# Patient Record
Sex: Male | Born: 1950 | Race: White | Hispanic: No | Marital: Married | Smoking: Former smoker
Health system: Southern US, Community
[De-identification: ages and names within clinical notes are randomized; demographics above are authoritative.]

## PROBLEM LIST (undated history)

## (undated) DIAGNOSIS — C801 Malignant (primary) neoplasm, unspecified: Secondary | ICD-10-CM

## (undated) DIAGNOSIS — B1920 Unspecified viral hepatitis C without hepatic coma: Secondary | ICD-10-CM

---

## 2004-02-09 ENCOUNTER — Ambulatory Visit: Payer: Self-pay | Admitting: Family Medicine

## 2006-10-26 ENCOUNTER — Ambulatory Visit: Payer: Self-pay | Admitting: Family Medicine

## 2006-10-26 DIAGNOSIS — L03019 Cellulitis of unspecified finger: Secondary | ICD-10-CM

## 2008-03-08 ENCOUNTER — Emergency Department (HOSPITAL_COMMUNITY): Admission: EM | Admit: 2008-03-08 | Discharge: 2008-03-08 | Payer: Self-pay | Admitting: Emergency Medicine

## 2008-03-28 ENCOUNTER — Ambulatory Visit: Payer: Self-pay | Admitting: Family Medicine

## 2008-03-28 DIAGNOSIS — B3749 Other urogenital candidiasis: Secondary | ICD-10-CM | POA: Insufficient documentation

## 2012-03-17 ENCOUNTER — Other Ambulatory Visit (HOSPITAL_COMMUNITY): Payer: Self-pay | Admitting: Dentistry

## 2012-03-17 DIAGNOSIS — C411 Malignant neoplasm of mandible: Secondary | ICD-10-CM

## 2012-03-23 ENCOUNTER — Ambulatory Visit (HOSPITAL_COMMUNITY)
Admission: RE | Admit: 2012-03-23 | Discharge: 2012-03-23 | Disposition: A | Discharge status: Home / Self Care | Payer: Medicaid Other | Source: Ambulatory Visit | Attending: Dentistry | Admitting: Dentistry

## 2012-03-23 DIAGNOSIS — C411 Malignant neoplasm of mandible: Secondary | ICD-10-CM | POA: Insufficient documentation

## 2012-03-23 DIAGNOSIS — K8020 Calculus of gallbladder without cholecystitis without obstruction: Secondary | ICD-10-CM | POA: Insufficient documentation

## 2012-03-23 MED ORDER — IOHEXOL 300 MG/ML  SOLN: 100.0000 mL | Freq: Once | INTRAMUSCULAR | Status: AC | PRN

## 2012-03-23 MED ORDER — IOHEXOL 300 MG/ML  SOLN
100.0000 mL | Freq: Once | INTRAMUSCULAR | Status: AC | PRN
  Administered 2012-03-23: 100 mL via INTRAVENOUS

## 2012-06-01 HISTORY — PX: OTHER SURGICAL HISTORY: SHX169

## 2012-08-14 ENCOUNTER — Encounter (HOSPITAL_COMMUNITY): Payer: Self-pay | Admitting: *Deleted

## 2012-08-14 ENCOUNTER — Emergency Department (HOSPITAL_COMMUNITY)
Admission: EM | Admit: 2012-08-14 | Discharge: 2012-08-14 | Disposition: A | Discharge status: Home / Self Care | Payer: Medicaid Other | Attending: Emergency Medicine | Admitting: Emergency Medicine

## 2012-08-14 DIAGNOSIS — Z8619 Personal history of other infectious and parasitic diseases: Secondary | ICD-10-CM | POA: Insufficient documentation

## 2012-08-14 DIAGNOSIS — R1110 Vomiting, unspecified: Secondary | ICD-10-CM

## 2012-08-14 DIAGNOSIS — Z79899 Other long term (current) drug therapy: Secondary | ICD-10-CM | POA: Insufficient documentation

## 2012-08-14 DIAGNOSIS — Z85810 Personal history of malignant neoplasm of tongue: Secondary | ICD-10-CM | POA: Insufficient documentation

## 2012-08-14 DIAGNOSIS — Z87891 Personal history of nicotine dependence: Secondary | ICD-10-CM | POA: Insufficient documentation

## 2012-08-14 HISTORY — DX: Malignant (primary) neoplasm, unspecified: C80.1

## 2012-08-14 HISTORY — DX: Unspecified viral hepatitis C without hepatic coma: B19.20

## 2012-08-14 LAB — COMPREHENSIVE METABOLIC PANEL
AST: 47 U/L — ABNORMAL HIGH (ref 0–37)
BUN: 8 mg/dL (ref 6–23)
CO2: 34 mEq/L — ABNORMAL HIGH (ref 19–32)
Chloride: 96 mEq/L (ref 96–112)
Creatinine, Ser: 0.63 mg/dL (ref 0.50–1.35)
GFR calc non Af Amer: >90 mL/min (ref >90)
Total Bilirubin: 0.7 mg/dL (ref 0.3–1.2)

## 2012-08-14 LAB — CBC
HCT: 45.6 % (ref 39.0–52.0)
MCV: 86.9 fL (ref 78.0–100.0)
RBC: 5.25 MIL/uL (ref 4.22–5.81)
WBC: 10.4 10*3/uL (ref 4.0–10.5)

## 2012-08-14 MED ORDER — ONDANSETRON HCL 4 MG/2ML IJ SOLN
4.0000 mg | Freq: Once | INTRAMUSCULAR | Status: DC
  Administered 2012-08-14: 4 mg via INTRAVENOUS

## 2012-08-14 MED ORDER — ONDANSETRON HCL 4 MG PO TABS: 4.0000 mg | ORAL_TABLET | Freq: Four times a day (QID) | ORAL | Status: AC

## 2012-08-14 MED ORDER — ONDANSETRON HCL 4 MG/2ML IJ SOLN
INTRAMUSCULAR | Status: AC
  Filled 2012-08-14: qty 2

## 2012-08-14 MED ORDER — SODIUM CHLORIDE 0.9 % IV BOLUS (SEPSIS)
1000.0000 mL | Freq: Once | INTRAVENOUS | Status: AC
  Administered 2012-08-14: 1000 mL via INTRAVENOUS

## 2012-08-14 MED ORDER — HYDROMORPHONE HCL PF 2 MG/ML IJ SOLN
2.0000 mg | Freq: Once | INTRAMUSCULAR | Status: AC
  Administered 2012-08-14: 2 mg via INTRAVENOUS
  Filled 2012-08-14: qty 1

## 2012-08-14 NOTE — ED Notes (Signed)
Pt ambulated to bathroom 

## 2012-08-14 NOTE — ED Notes (Signed)
Pt received feeding through g-tube 30 minutes ago, does not feel nauseated at all. Pain still under control.

## 2012-08-14 NOTE — ED Provider Notes (Signed)
History     CSN: [id number]  Arrival date & time 08/14/12  7829   First MD Initiated Contact with Patient 08/14/12 0940      No chief complaint on file.   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  This was treated surgically two months and he travels to [facility] for radiation 5 days a week.  Since yesterday, the patient has been experiencing severe nausea and vomiting.  He receives his nourishment through feeding tube and seems to vomit this up immediately.  He denies abdominal pain or diarrhea.  No fevers or chills.    The history is provided by the patient and the spouse.    Past Medical History  Diagnosis Date  . Cancer     tongue and neck  . Hepatitis C     Past Surgical History  Procedure Laterality Date  . Other surgical history  06/01/2012    part of tongue removed and reconstructed    No family history on file.  History  Substance Use Topics  . Smoking status: Former Games developer  . Smokeless tobacco: Never Used  . Alcohol Use: No      Review of Systems  All other systems reviewed and are negative.    Allergies  Codeine  Home Medications   Current Outpatient Rx  Name  Route  Sig  Dispense  Refill  . Alum & Mag Hydroxide-Simeth (GI COCKTAIL) SUSP suspension   Swish & Spit   Swish and spit 5 mLs 4 (four) times daily as needed (for pain). Shake well.         . Alum & Mag Hydroxide-Simeth (MAGIC MOUTHWASH) SOLN   Swish & Spit   Swish and spit 10 mLs 4 (four) times daily as needed (for thrush).         . fentaNYL (DURAGESIC - DOSED MCG/HR) 25 MCG/HR   Transdermal   Place 1 patch onto the skin every 3 (three) days.         Marland Kitchen oxyCODONE (ROXICODONE) 5 MG/5ML solution   Oral   Take 5-10 mg by mouth every 4 (four) hours as needed for pain.         Marland Kitchen prochlorperazine (COMPAZINE) 10 MG tablet   Tube   Give 10 mg by  tube every 6 (six) hours as needed (for nausea/vomiting).           BP 123/70  Pulse 109  Temp(Src) 98.1 F (36.7 C) (Oral)  Resp 18  Wt 163 lb (73.936 kg)  SpO2 95%  Physical Exam  Nursing note and vitals reviewed. Constitutional: He is oriented to person, place, and time. He appears well-developed and well-nourished. No distress.  HENT:  Head: Normocephalic and atraumatic.  The PO is erythematous and appears inflamed.  There is no bleeding.  He does seem to cough and produce a clear phlegm.  Eyes: EOM are normal. Pupils are equal, round, and reactive to light.  Neck: Normal range of motion. Neck supple.  Cardiovascular: Normal rate and regular rhythm.   No murmur heard. Pulmonary/Chest: Effort normal and breath sounds normal. No respiratory distress. He has no wheezes. He has no rales.  Abdominal: Soft. Bowel sounds are normal. He exhibits no distension. There is no tenderness.  Musculoskeletal: Normal range of motion. He exhibits no edema.  Neurological: He is alert and oriented to person, place, and time.  Skin: Skin is warm and dry. He is not diaphoretic.  ED Course  Procedures (including critical care time)  Labs Reviewed  CBC  COMPREHENSIVE METABOLIC PANEL   No results found.   No diagnosis found.    MDM  The patient presents here with nausea and difficulty tolerating his tube feeds.  He was given ivf's and labs seem to be reassuring.  He does not appear clinically dehydrated and was able to keep a feed down here in the ED.  He feels better and wants to go home.  Wife is asking for zofran which I provide a prescription for.        Geoffery Lyons, MD 08/14/12 1248

## 2019-08-30 DIAGNOSIS — J90 Pleural effusion, not elsewhere classified: Secondary | ICD-10-CM | POA: Diagnosis not present

## 2019-08-30 DIAGNOSIS — R531 Weakness: Secondary | ICD-10-CM

## 2019-08-31 ENCOUNTER — Inpatient Hospital Stay (HOSPITAL_COMMUNITY)
Admission: AD | Admit: 2019-08-31 | Discharge: 2019-09-09 | DRG: 163 | Disposition: A | Discharge status: Home / Self Care | Payer: No Typology Code available for payment source | Source: Other Acute Inpatient Hospital | Attending: Internal Medicine | Admitting: Internal Medicine

## 2019-08-31 DIAGNOSIS — E871 Hypo-osmolality and hyponatremia: Secondary | ICD-10-CM | POA: Diagnosis present

## 2019-08-31 DIAGNOSIS — J9811 Atelectasis: Secondary | ICD-10-CM | POA: Diagnosis present

## 2019-08-31 DIAGNOSIS — R000 Tachycardia, unspecified: Secondary | ICD-10-CM | POA: Diagnosis not present

## 2019-08-31 DIAGNOSIS — Y839 Surgical procedure, unspecified as the cause of abnormal reaction of the patient, or of later complication, without mention of misadventure at the time of the procedure: Secondary | ICD-10-CM | POA: Diagnosis not present

## 2019-08-31 DIAGNOSIS — D509 Iron deficiency anemia, unspecified: Secondary | ICD-10-CM | POA: Diagnosis present

## 2019-08-31 DIAGNOSIS — J939 Pneumothorax, unspecified: Secondary | ICD-10-CM

## 2019-08-31 DIAGNOSIS — I9589 Other hypotension: Secondary | ICD-10-CM | POA: Diagnosis not present

## 2019-08-31 DIAGNOSIS — Z8619 Personal history of other infectious and parasitic diseases: Secondary | ICD-10-CM | POA: Diagnosis not present

## 2019-08-31 DIAGNOSIS — Z9689 Presence of other specified functional implants: Secondary | ICD-10-CM

## 2019-08-31 DIAGNOSIS — Z681 Body mass index (BMI) 19 or less, adult: Secondary | ICD-10-CM | POA: Diagnosis not present

## 2019-08-31 DIAGNOSIS — M7989 Other specified soft tissue disorders: Secondary | ICD-10-CM | POA: Diagnosis not present

## 2019-08-31 DIAGNOSIS — Z20822 Contact with and (suspected) exposure to covid-19: Secondary | ICD-10-CM | POA: Diagnosis present

## 2019-08-31 DIAGNOSIS — R531 Weakness: Secondary | ICD-10-CM | POA: Diagnosis not present

## 2019-08-31 DIAGNOSIS — Z885 Allergy status to narcotic agent status: Secondary | ICD-10-CM | POA: Diagnosis not present

## 2019-08-31 DIAGNOSIS — F329 Major depressive disorder, single episode, unspecified: Secondary | ICD-10-CM | POA: Diagnosis present

## 2019-08-31 DIAGNOSIS — E43 Unspecified severe protein-calorie malnutrition: Secondary | ICD-10-CM | POA: Diagnosis present

## 2019-08-31 DIAGNOSIS — R54 Age-related physical debility: Secondary | ICD-10-CM | POA: Diagnosis present

## 2019-08-31 DIAGNOSIS — Z85819 Personal history of malignant neoplasm of unspecified site of lip, oral cavity, and pharynx: Secondary | ICD-10-CM | POA: Diagnosis not present

## 2019-08-31 DIAGNOSIS — J90 Pleural effusion, not elsewhere classified: Secondary | ICD-10-CM | POA: Diagnosis not present

## 2019-08-31 DIAGNOSIS — Z79899 Other long term (current) drug therapy: Secondary | ICD-10-CM

## 2019-08-31 DIAGNOSIS — R0902 Hypoxemia: Secondary | ICD-10-CM | POA: Diagnosis present

## 2019-08-31 DIAGNOSIS — Z800 Family history of malignant neoplasm of digestive organs: Secondary | ICD-10-CM

## 2019-08-31 DIAGNOSIS — Z79891 Long term (current) use of opiate analgesic: Secondary | ICD-10-CM

## 2019-08-31 DIAGNOSIS — Z923 Personal history of irradiation: Secondary | ICD-10-CM | POA: Diagnosis not present

## 2019-08-31 DIAGNOSIS — R64 Cachexia: Secondary | ICD-10-CM | POA: Diagnosis present

## 2019-08-31 DIAGNOSIS — Z8249 Family history of ischemic heart disease and other diseases of the circulatory system: Secondary | ICD-10-CM

## 2019-08-31 DIAGNOSIS — E1165 Type 2 diabetes mellitus with hyperglycemia: Secondary | ICD-10-CM | POA: Diagnosis present

## 2019-08-31 DIAGNOSIS — J95811 Postprocedural pneumothorax: Secondary | ICD-10-CM | POA: Diagnosis not present

## 2019-08-31 DIAGNOSIS — Z419 Encounter for procedure for purposes other than remedying health state, unspecified: Secondary | ICD-10-CM

## 2019-08-31 DIAGNOSIS — J869 Pyothorax without fistula: Principal | ICD-10-CM | POA: Diagnosis present

## 2019-08-31 DIAGNOSIS — E878 Other disorders of electrolyte and fluid balance, not elsewhere classified: Secondary | ICD-10-CM | POA: Diagnosis present

## 2019-08-31 DIAGNOSIS — Z87891 Personal history of nicotine dependence: Secondary | ICD-10-CM

## 2019-08-31 DIAGNOSIS — E876 Hypokalemia: Secondary | ICD-10-CM | POA: Diagnosis present

## 2019-08-31 DIAGNOSIS — I808 Phlebitis and thrombophlebitis of other sites: Secondary | ICD-10-CM | POA: Diagnosis not present

## 2019-08-31 DIAGNOSIS — Z931 Gastrostomy status: Secondary | ICD-10-CM | POA: Diagnosis not present

## 2019-08-31 DIAGNOSIS — Z833 Family history of diabetes mellitus: Secondary | ICD-10-CM

## 2019-08-31 DIAGNOSIS — R0602 Shortness of breath: Secondary | ICD-10-CM

## 2019-08-31 MED ORDER — ACETAMINOPHEN 325 MG PO TABS: 650.0000 mg | ORAL_TABLET | Freq: Four times a day (QID) | ORAL | Status: DC | PRN

## 2019-08-31 MED ORDER — ONDANSETRON HCL 4 MG PO TABS: 4.0000 mg | ORAL_TABLET | Freq: Four times a day (QID) | ORAL | Status: DC | PRN

## 2019-08-31 MED ORDER — ONDANSETRON HCL 4 MG/2ML IJ SOLN: 4.0000 mg | Freq: Four times a day (QID) | INTRAMUSCULAR | Status: DC | PRN

## 2019-08-31 MED ORDER — ACETAMINOPHEN 650 MG RE SUPP: 650.0000 mg | Freq: Four times a day (QID) | RECTAL | Status: DC | PRN

## 2019-08-31 MED ORDER — POLYETHYLENE GLYCOL 3350 17 G PO PACK: 17.0000 g | PACK | Freq: Every day | ORAL | Status: DC | PRN

## 2019-09-01 ENCOUNTER — Inpatient Hospital Stay (HOSPITAL_COMMUNITY): Payer: No Typology Code available for payment source | Admitting: Anesthesiology

## 2019-09-01 ENCOUNTER — Encounter (HOSPITAL_COMMUNITY)
Admission: AD | Disposition: A | Discharge status: Home / Self Care | Payer: Self-pay | Source: Other Acute Inpatient Hospital | Attending: Cardiothoracic Surgery

## 2019-09-01 ENCOUNTER — Inpatient Hospital Stay (HOSPITAL_COMMUNITY): Payer: No Typology Code available for payment source

## 2019-09-01 ENCOUNTER — Encounter (HOSPITAL_COMMUNITY): Payer: Self-pay | Admitting: Internal Medicine

## 2019-09-01 ENCOUNTER — Other Ambulatory Visit: Payer: Self-pay

## 2019-09-01 DIAGNOSIS — J869 Pyothorax without fistula: Secondary | ICD-10-CM

## 2019-09-01 DIAGNOSIS — E1165 Type 2 diabetes mellitus with hyperglycemia: Secondary | ICD-10-CM

## 2019-09-01 DIAGNOSIS — E871 Hypo-osmolality and hyponatremia: Secondary | ICD-10-CM

## 2019-09-01 DIAGNOSIS — E43 Unspecified severe protein-calorie malnutrition: Secondary | ICD-10-CM

## 2019-09-01 DIAGNOSIS — B1920 Unspecified viral hepatitis C without hepatic coma: Secondary | ICD-10-CM | POA: Insufficient documentation

## 2019-09-01 HISTORY — PX: CENTRAL VENOUS CATHETER INSERTION: SHX401

## 2019-09-01 HISTORY — PX: VIDEO ASSISTED THORACOSCOPY (VATS)/EMPYEMA: SHX6172

## 2019-09-01 LAB — COMPREHENSIVE METABOLIC PANEL
ALT: 24 U/L (ref 0–44)
AST: 35 U/L (ref 15–41)
Albumin: 1.5 g/dL — ABNORMAL LOW (ref 3.5–5.0)
Alkaline Phosphatase: 75 U/L (ref 38–126)
Anion gap: 7 (ref 5–15)
BUN: <5 mg/dL — ABNORMAL LOW (ref 8–23)
CO2: 37 mmol/L — ABNORMAL HIGH (ref 22–32)
Calcium: 7.9 mg/dL — ABNORMAL LOW (ref 8.9–10.3)
Chloride: 84 mmol/L — ABNORMAL LOW (ref 98–111)
Creatinine, Ser: 0.59 mg/dL — ABNORMAL LOW (ref 0.61–1.24)
GFR calc Af Amer: >60 mL/min (ref >60)
GFR calc non Af Amer: >60 mL/min (ref >60)
Glucose, Bld: 148 mg/dL — ABNORMAL HIGH (ref 70–99)
Potassium: 3.5 mmol/L (ref 3.5–5.1)
Sodium: 128 mmol/L — ABNORMAL LOW (ref 135–145)
Total Bilirubin: 0.6 mg/dL (ref 0.3–1.2)
Total Protein: 6.1 g/dL — ABNORMAL LOW (ref 6.5–8.1)

## 2019-09-01 LAB — HEMOGLOBIN A1C
Hgb A1c MFr Bld: 8 % — ABNORMAL HIGH (ref 4.8–5.6)
Mean Plasma Glucose: 182.9 mg/dL

## 2019-09-01 LAB — CBC WITH DIFFERENTIAL/PLATELET
Abs Immature Granulocytes: 0.05 10*3/uL (ref 0.00–0.07)
Basophils Absolute: 0 10*3/uL (ref 0.0–0.1)
Basophils Relative: 0 %
Eosinophils Absolute: 0 10*3/uL (ref 0.0–0.5)
Eosinophils Relative: 0 %
HCT: 36.8 % — ABNORMAL LOW (ref 39.0–52.0)
Hemoglobin: 11.9 g/dL — ABNORMAL LOW (ref 13.0–17.0)
Immature Granulocytes: 1 %
Lymphocytes Relative: 5 %
Lymphs Abs: 0.5 10*3/uL — ABNORMAL LOW (ref 0.7–4.0)
MCH: 28.5 pg (ref 26.0–34.0)
MCHC: 32.3 g/dL (ref 30.0–36.0)
MCV: 88.2 fL (ref 80.0–100.0)
Monocytes Absolute: 0.7 10*3/uL (ref 0.1–1.0)
Monocytes Relative: 7 %
Neutro Abs: 9.6 10*3/uL — ABNORMAL HIGH (ref 1.7–7.7)
Neutrophils Relative %: 87 %
Platelets: 580 10*3/uL — ABNORMAL HIGH (ref 150–400)
RBC: 4.17 MIL/uL — ABNORMAL LOW (ref 4.22–5.81)
RDW: 12.8 % (ref 11.5–15.5)
WBC: 10.9 10*3/uL — ABNORMAL HIGH (ref 4.0–10.5)
nRBC: 0 % (ref 0.0–0.2)

## 2019-09-01 LAB — POCT I-STAT 7, (LYTES, BLD GAS, ICA,H+H)
Acid-Base Excess: 11 mmol/L — ABNORMAL HIGH (ref 0.0–2.0)
Acid-Base Excess: 9 mmol/L — ABNORMAL HIGH (ref 0.0–2.0)
Bicarbonate: 36.2 mmol/L — ABNORMAL HIGH (ref 20.0–28.0)
Bicarbonate: 34.6 mmol/L — ABNORMAL HIGH (ref 20.0–28.0)
Calcium, Ion: 1.09 mmol/L — ABNORMAL LOW (ref 1.15–1.40)
Calcium, Ion: 1.1 mmol/L — ABNORMAL LOW (ref 1.15–1.40)
HCT: 37 % — ABNORMAL LOW (ref 39.0–52.0)
HCT: 40 % (ref 39.0–52.0)
Hemoglobin: 12.6 g/dL — ABNORMAL LOW (ref 13.0–17.0)
Hemoglobin: 13.6 g/dL (ref 13.0–17.0)
O2 Saturation: 100 %
O2 Saturation: 98 %
Patient temperature: 97.6
Patient temperature: 97.6
Potassium: 3.3 mmol/L — ABNORMAL LOW (ref 3.5–5.1)
Potassium: 3.4 mmol/L — ABNORMAL LOW (ref 3.5–5.1)
Sodium: 131 mmol/L — ABNORMAL LOW (ref 135–145)
Sodium: 130 mmol/L — ABNORMAL LOW (ref 135–145)
TCO2: 38 mmol/L — ABNORMAL HIGH (ref 22–32)
TCO2: 36 mmol/L — ABNORMAL HIGH (ref 22–32)
pCO2 arterial: 48.5 mmHg — ABNORMAL HIGH (ref 32.0–48.0)
pCO2 arterial: 50.5 mmHg — ABNORMAL HIGH (ref 32.0–48.0)
pH, Arterial: 7.479 — ABNORMAL HIGH (ref 7.350–7.450)
pH, Arterial: 7.442 (ref 7.350–7.450)
pO2, Arterial: 174 mmHg — ABNORMAL HIGH (ref 83.0–108.0)
pO2, Arterial: 109 mmHg — ABNORMAL HIGH (ref 83.0–108.0)

## 2019-09-01 LAB — BLOOD GAS, ARTERIAL
Acid-Base Excess: 14.9 mmol/L — ABNORMAL HIGH (ref 0.0–2.0)
Bicarbonate: 39.1 mmol/L — ABNORMAL HIGH (ref 20.0–28.0)
FIO2: 32
O2 Saturation: 95.5 %
Patient temperature: 37
pCO2 arterial: 47 mmHg (ref 32.0–48.0)
pH, Arterial: 7.53 — ABNORMAL HIGH (ref 7.350–7.450)
pO2, Arterial: 71.2 mmHg — ABNORMAL LOW (ref 83.0–108.0)

## 2019-09-01 LAB — GLUCOSE, CAPILLARY
Glucose-Capillary: 150 mg/dL — ABNORMAL HIGH (ref 70–99)
Glucose-Capillary: 119 mg/dL — ABNORMAL HIGH (ref 70–99)
Glucose-Capillary: 115 mg/dL — ABNORMAL HIGH (ref 70–99)
Glucose-Capillary: 109 mg/dL — ABNORMAL HIGH (ref 70–99)
Glucose-Capillary: 129 mg/dL — ABNORMAL HIGH (ref 70–99)

## 2019-09-01 LAB — BASIC METABOLIC PANEL
Anion gap: 9 (ref 5–15)
BUN: <5 mg/dL — ABNORMAL LOW (ref 8–23)
CO2: 34 mmol/L — ABNORMAL HIGH (ref 22–32)
Calcium: 7.6 mg/dL — ABNORMAL LOW (ref 8.9–10.3)
Chloride: 84 mmol/L — ABNORMAL LOW (ref 98–111)
Creatinine, Ser: 0.55 mg/dL — ABNORMAL LOW (ref 0.61–1.24)
GFR calc Af Amer: >60 mL/min (ref >60)
GFR calc non Af Amer: >60 mL/min (ref >60)
Glucose, Bld: 118 mg/dL — ABNORMAL HIGH (ref 70–99)
Potassium: 3.3 mmol/L — ABNORMAL LOW (ref 3.5–5.1)
Sodium: 127 mmol/L — ABNORMAL LOW (ref 135–145)

## 2019-09-01 LAB — URINALYSIS, ROUTINE W REFLEX MICROSCOPIC
Bilirubin Urine: NEGATIVE
Glucose, UA: NEGATIVE mg/dL
Hgb urine dipstick: NEGATIVE
Ketones, ur: NEGATIVE mg/dL
Leukocytes,Ua: NEGATIVE
Nitrite: NEGATIVE
Protein, ur: NEGATIVE mg/dL
Specific Gravity, Urine: 1.024 (ref 1.005–1.030)
pH: 7 (ref 5.0–8.0)

## 2019-09-01 LAB — APTT: aPTT: 37 seconds — ABNORMAL HIGH (ref 24–36)

## 2019-09-01 LAB — LACTIC ACID, PLASMA: Lactic Acid, Venous: 1.1 mmol/L (ref 0.5–1.9)

## 2019-09-01 LAB — PROTIME-INR
INR: 1.1 (ref 0.8–1.2)
Prothrombin Time: 13.4 seconds (ref 11.4–15.2)

## 2019-09-01 LAB — MAGNESIUM: Magnesium: 1.8 mg/dL (ref 1.7–2.4)

## 2019-09-01 LAB — C-REACTIVE PROTEIN: CRP: 13.2 mg/dL — ABNORMAL HIGH (ref <1.0)

## 2019-09-01 LAB — PREPARE RBC (CROSSMATCH)

## 2019-09-01 LAB — ABO/RH: ABO/RH(D): A POS

## 2019-09-01 LAB — SURGICAL PCR SCREEN
MRSA, PCR: NEGATIVE
Staphylococcus aureus: NEGATIVE

## 2019-09-01 LAB — SARS CORONAVIRUS 2 BY RT PCR (HOSPITAL ORDER, PERFORMED IN ~~LOC~~ HOSPITAL LAB): SARS Coronavirus 2: NEGATIVE

## 2019-09-01 LAB — TSH: TSH: 9.442 u[IU]/mL — ABNORMAL HIGH (ref 0.350–4.500)

## 2019-09-01 LAB — HIV ANTIBODY (ROUTINE TESTING W REFLEX): HIV Screen 4th Generation wRfx: NONREACTIVE

## 2019-09-01 LAB — OSMOLALITY, URINE: Osmolality, Ur: 529 mOsm/kg (ref 300–900)

## 2019-09-01 LAB — OSMOLALITY: Osmolality: 270 mOsm/kg — ABNORMAL LOW (ref 275–295)

## 2019-09-01 LAB — SODIUM, URINE, RANDOM: Sodium, Ur: 127 mmol/L

## 2019-09-01 LAB — CORTISOL: Cortisol, Plasma: 18.9 ug/dL

## 2019-09-01 SURGERY — VIDEO ASSISTED THORACOSCOPY (VATS)/EMPYEMA
Anesthesia: General | Site: Neck | Laterality: Right

## 2019-09-01 MED ORDER — SUCCINYLCHOLINE CHLORIDE 200 MG/10ML IV SOSY
PREFILLED_SYRINGE | INTRAVENOUS | Status: DC | PRN
Start: 2019-09-01 — End: 2019-09-01
  Administered 2019-09-01: 80 mg via INTRAVENOUS

## 2019-09-01 MED ORDER — VANCOMYCIN HCL 750 MG/150ML IV SOLN
750.0000 mg | Freq: Two times a day (BID) | INTRAVENOUS | Status: DC
  Administered 2019-09-02 – 2019-09-05 (×7): 750 mg via INTRAVENOUS
  Filled 2019-09-01 (×8): qty 150

## 2019-09-01 MED ORDER — ONDANSETRON HCL 4 MG/2ML IJ SOLN
INTRAMUSCULAR | Status: DC | PRN
  Administered 2019-09-01: 4 mg via INTRAVENOUS

## 2019-09-01 MED ORDER — GLUCERNA 1.5 CAL PO LIQD: 474.0000 mL | Freq: Four times a day (QID) | ORAL | Status: DC

## 2019-09-01 MED ORDER — ORAL CARE MOUTH RINSE
15.0000 mL | OROMUCOSAL | Status: DC
  Administered 2019-09-01 – 2019-09-02 (×5): 15 mL via OROMUCOSAL

## 2019-09-01 MED ORDER — VANCOMYCIN HCL 750 MG/150ML IV SOLN
750.0000 mg | Freq: Two times a day (BID) | INTRAVENOUS | Status: DC
  Administered 2019-09-01: 750 mg via INTRAVENOUS
  Filled 2019-09-01: qty 150

## 2019-09-01 MED ORDER — POLYETHYLENE GLYCOL 3350 17 G PO PACK: 17.0000 g | PACK | Freq: Every day | ORAL | Status: DC | PRN

## 2019-09-01 MED ORDER — ACETAMINOPHEN 325 MG PO TABS: 650.0000 mg | ORAL_TABLET | Freq: Four times a day (QID) | ORAL | Status: DC | PRN

## 2019-09-01 MED ORDER — FENTANYL CITRATE (PF) 100 MCG/2ML IJ SOLN: 25.0000 ug | INTRAMUSCULAR | Status: DC | PRN

## 2019-09-01 MED ORDER — CHLORHEXIDINE GLUCONATE 0.12 % MT SOLN: 15.0000 mL | Freq: Once | OROMUCOSAL | Status: DC

## 2019-09-01 MED ORDER — MIDAZOLAM HCL 2 MG/2ML IJ SOLN: 1.0000 mg | INTRAMUSCULAR | Status: DC | PRN

## 2019-09-01 MED ORDER — METOCLOPRAMIDE HCL 5 MG/ML IJ SOLN
5.0000 mg | Freq: Four times a day (QID) | INTRAMUSCULAR | Status: DC
  Administered 2019-09-01 – 2019-09-09 (×30): 5 mg via INTRAVENOUS
  Filled 2019-09-01 (×30): qty 2

## 2019-09-01 MED ORDER — TRAZODONE HCL 50 MG PO TABS: 25.0000 mg | ORAL_TABLET | Freq: Every day | ORAL | Status: DC

## 2019-09-01 MED ORDER — KETOROLAC TROMETHAMINE 30 MG/ML IJ SOLN
INTRAMUSCULAR | Status: AC
  Filled 2019-09-01: qty 1

## 2019-09-01 MED ORDER — SODIUM CHLORIDE 0.9% FLUSH: 10.0000 mL | INTRAVENOUS | Status: DC | PRN

## 2019-09-01 MED ORDER — ALBUTEROL SULFATE HFA 108 (90 BASE) MCG/ACT IN AERS
INHALATION_SPRAY | RESPIRATORY_TRACT | Status: AC
  Filled 2019-09-01: qty 6.7

## 2019-09-01 MED ORDER — LIVING WELL WITH DIABETES BOOK
Freq: Once | Status: AC
  Filled 2019-09-01: qty 1

## 2019-09-01 MED ORDER — INSULIN ASPART 100 UNIT/ML ~~LOC~~ SOLN: 0.0000 [IU] | SUBCUTANEOUS | Status: DC

## 2019-09-01 MED ORDER — FENTANYL CITRATE (PF) 100 MCG/2ML IJ SOLN
INTRAMUSCULAR | Status: DC | PRN
  Administered 2019-09-01 (×3): 50 ug via INTRAVENOUS

## 2019-09-01 MED ORDER — SODIUM CHLORIDE 0.9 % IV SOLN: INTRAVENOUS | Status: DC

## 2019-09-01 MED ORDER — BISACODYL 5 MG PO TBEC
10.0000 mg | DELAYED_RELEASE_TABLET | Freq: Every day | ORAL | Status: DC
  Administered 2019-09-02: 10 mg via ORAL
  Filled 2019-09-01: qty 2

## 2019-09-01 MED ORDER — PHENYLEPHRINE 40 MCG/ML (10ML) SYRINGE FOR IV PUSH (FOR BLOOD PRESSURE SUPPORT)
PREFILLED_SYRINGE | INTRAVENOUS | Status: AC
  Filled 2019-09-01: qty 40

## 2019-09-01 MED ORDER — TRAMADOL HCL 50 MG PO TABS: 50.0000 mg | ORAL_TABLET | Freq: Four times a day (QID) | ORAL | Status: DC | PRN

## 2019-09-01 MED ORDER — SODIUM CHLORIDE 0.9% FLUSH
10.0000 mL | Freq: Two times a day (BID) | INTRAVENOUS | Status: DC
  Administered 2019-09-01 – 2019-09-09 (×9): 10 mL

## 2019-09-01 MED ORDER — DULOXETINE HCL 60 MG PO CPEP
60.0000 mg | ORAL_CAPSULE | Freq: Every day | ORAL | Status: DC
  Administered 2019-09-02: 60 mg via ORAL
  Filled 2019-09-01: qty 1

## 2019-09-01 MED ORDER — ALBUMIN HUMAN 25 % IV SOLN
12.5000 g | Freq: Four times a day (QID) | INTRAVENOUS | Status: AC
  Administered 2019-09-01 – 2019-09-02 (×3): 12.5 g via INTRAVENOUS
  Filled 2019-09-01 (×3): qty 50

## 2019-09-01 MED ORDER — 0.9 % SODIUM CHLORIDE (POUR BTL) OPTIME
TOPICAL | Status: DC | PRN
  Administered 2019-09-01: 2000 mL

## 2019-09-01 MED ORDER — IPRATROPIUM-ALBUTEROL 0.5-2.5 (3) MG/3ML IN SOLN: 3.0000 mL | RESPIRATORY_TRACT | Status: DC | PRN

## 2019-09-01 MED ORDER — LIDOCAINE 2% (20 MG/ML) 5 ML SYRINGE
INTRAMUSCULAR | Status: DC | PRN
  Administered 2019-09-01: 60 mg via INTRAVENOUS

## 2019-09-01 MED ORDER — LACTATED RINGERS IV SOLN: INTRAVENOUS | Status: DC

## 2019-09-01 MED ORDER — ACETAMINOPHEN 500 MG PO TABS: 1000.0000 mg | ORAL_TABLET | Freq: Four times a day (QID) | ORAL | Status: DC

## 2019-09-01 MED ORDER — DEXTROSE-NACL 5-0.45 % IV SOLN: INTRAVENOUS | Status: DC

## 2019-09-01 MED ORDER — GUAIFENESIN-DM 100-10 MG/5ML PO SYRP
10.0000 mL | ORAL_SOLUTION | Freq: Four times a day (QID) | ORAL | Status: DC
  Administered 2019-09-01 – 2019-09-02 (×4): 10 mL via ORAL
  Filled 2019-09-01 (×4): qty 10

## 2019-09-01 MED ORDER — ONDANSETRON HCL 4 MG/2ML IJ SOLN
INTRAMUSCULAR | Status: AC
  Filled 2019-09-01: qty 6

## 2019-09-01 MED ORDER — ACETAMINOPHEN 160 MG/5ML PO SOLN
1000.0000 mg | Freq: Four times a day (QID) | ORAL | Status: DC
  Administered 2019-09-01 – 2019-09-02 (×2): 1000 mg via ORAL
  Filled 2019-09-01 (×2): qty 40.6

## 2019-09-01 MED ORDER — ALBUMIN HUMAN 5 % IV SOLN: INTRAVENOUS | Status: DC | PRN

## 2019-09-01 MED ORDER — ONDANSETRON HCL 4 MG/2ML IJ SOLN: 4.0000 mg | Freq: Four times a day (QID) | INTRAMUSCULAR | Status: DC | PRN

## 2019-09-01 MED ORDER — CHLORHEXIDINE GLUCONATE CLOTH 2 % EX PADS
6.0000 | MEDICATED_PAD | Freq: Every day | CUTANEOUS | Status: DC
  Administered 2019-09-02: 6 via TOPICAL

## 2019-09-01 MED ORDER — SODIUM CHLORIDE 0.9% IV SOLUTION: Freq: Once | INTRAVENOUS | Status: DC

## 2019-09-01 MED ORDER — DM-GUAIFENESIN ER 30-600 MG PO TB12
1.0000 | ORAL_TABLET | Freq: Two times a day (BID) | ORAL | Status: DC
  Filled 2019-09-01: qty 1

## 2019-09-01 MED ORDER — SENNOSIDES-DOCUSATE SODIUM 8.6-50 MG PO TABS: 2.0000 | ORAL_TABLET | Freq: Every evening | ORAL | Status: DC | PRN

## 2019-09-01 MED ORDER — MORPHINE SULFATE (PF) 2 MG/ML IV SOLN: 2.0000 mg | INTRAVENOUS | Status: DC | PRN

## 2019-09-01 MED ORDER — ESMOLOL HCL 100 MG/10ML IV SOLN
INTRAVENOUS | Status: AC
  Filled 2019-09-01: qty 10

## 2019-09-01 MED ORDER — MORPHINE SULFATE (PF) 2 MG/ML IV SOLN
2.0000 mg | INTRAVENOUS | Status: DC | PRN
  Administered 2019-09-01: 2 mg via INTRAVENOUS
  Filled 2019-09-01 (×2): qty 1

## 2019-09-01 MED ORDER — PHENYLEPHRINE 40 MCG/ML (10ML) SYRINGE FOR IV PUSH (FOR BLOOD PRESSURE SUPPORT)
PREFILLED_SYRINGE | INTRAVENOUS | Status: DC | PRN
  Administered 2019-09-01: 120 ug via INTRAVENOUS
  Administered 2019-09-01: 160 ug via INTRAVENOUS
  Administered 2019-09-01: 120 ug via INTRAVENOUS

## 2019-09-01 MED ORDER — DEXAMETHASONE SODIUM PHOSPHATE 10 MG/ML IJ SOLN
INTRAMUSCULAR | Status: AC
  Filled 2019-09-01: qty 3

## 2019-09-01 MED ORDER — SENNOSIDES-DOCUSATE SODIUM 8.6-50 MG PO TABS
1.0000 | ORAL_TABLET | Freq: Every day | ORAL | Status: DC
  Administered 2019-09-01: 1 via ORAL
  Filled 2019-09-01: qty 1

## 2019-09-01 MED ORDER — PHENYLEPHRINE HCL-NACL 10-0.9 MG/250ML-% IV SOLN
INTRAVENOUS | Status: DC | PRN
  Administered 2019-09-01: 50 ug/min via INTRAVENOUS

## 2019-09-01 MED ORDER — FENTANYL CITRATE (PF) 100 MCG/2ML IJ SOLN
25.0000 ug | INTRAMUSCULAR | Status: DC | PRN
  Administered 2019-09-01 – 2019-09-02 (×2): 100 ug via INTRAVENOUS
  Filled 2019-09-01 (×2): qty 2

## 2019-09-01 MED ORDER — POTASSIUM CHLORIDE IN NACL 20-0.9 MEQ/L-% IV SOLN
INTRAVENOUS | Status: DC
  Filled 2019-09-01 (×12): qty 1000

## 2019-09-01 MED ORDER — SODIUM CHLORIDE 0.9 % IV SOLN
1.0000 g | Freq: Three times a day (TID) | INTRAVENOUS | Status: DC
  Administered 2019-09-01 – 2019-09-07 (×19): 1 g via INTRAVENOUS
  Filled 2019-09-01 (×26): qty 1

## 2019-09-01 MED ORDER — DEXAMETHASONE SODIUM PHOSPHATE 10 MG/ML IJ SOLN
INTRAMUSCULAR | Status: DC | PRN
  Administered 2019-09-01: 10 mg via INTRAVENOUS

## 2019-09-01 MED ORDER — FREE WATER: 100.0000 mL | Freq: Every day | Status: DC

## 2019-09-01 MED ORDER — IOHEXOL 300 MG/ML  SOLN
75.0000 mL | Freq: Once | INTRAMUSCULAR | Status: AC | PRN
  Administered 2019-09-01: 75 mL via INTRAVENOUS

## 2019-09-01 MED ORDER — ADULT MULTIVITAMIN LIQUID CH
15.0000 mL | Freq: Every day | ORAL | Status: DC
  Administered 2019-09-02: 15 mL via ORAL
  Filled 2019-09-01: qty 15

## 2019-09-01 MED ORDER — SUCCINYLCHOLINE CHLORIDE 200 MG/10ML IV SOSY
PREFILLED_SYRINGE | INTRAVENOUS | Status: AC
  Filled 2019-09-01: qty 10

## 2019-09-01 MED ORDER — FERROUS SULFATE 220 (44 FE) MG/5ML PO ELIX
300.0000 mg | ORAL_SOLUTION | Freq: Every day | ORAL | Status: DC
  Administered 2019-09-02 – 2019-09-03 (×2): 300 mg
  Filled 2019-09-01 (×4): qty 6.9

## 2019-09-01 MED ORDER — NALOXONE HCL 0.4 MG/ML IJ SOLN
INTRAMUSCULAR | Status: AC
  Filled 2019-09-01: qty 1

## 2019-09-01 MED ORDER — CHLORHEXIDINE GLUCONATE 0.12% ORAL RINSE (MEDLINE KIT)
15.0000 mL | Freq: Two times a day (BID) | OROMUCOSAL | Status: DC
  Administered 2019-09-01 – 2019-09-02 (×2): 15 mL via OROMUCOSAL

## 2019-09-01 MED ORDER — PROPOFOL 10 MG/ML IV BOLUS
INTRAVENOUS | Status: DC | PRN
  Administered 2019-09-01 (×2): 50 mg via INTRAVENOUS

## 2019-09-01 MED ORDER — OXYCODONE HCL 5 MG/5ML PO SOLN
5.0000 mg | ORAL | Status: DC | PRN
  Administered 2019-09-01 (×2): 5 mg
  Filled 2019-09-01 (×2): qty 5

## 2019-09-01 MED ORDER — SODIUM CHLORIDE 0.9 % IV SOLN: INTRAVENOUS | Status: DC | PRN

## 2019-09-01 MED ORDER — ROCURONIUM BROMIDE 10 MG/ML (PF) SYRINGE
PREFILLED_SYRINGE | INTRAVENOUS | Status: AC
  Filled 2019-09-01: qty 10

## 2019-09-01 MED ORDER — PHENYLEPHRINE HCL-NACL 10-0.9 MG/250ML-% IV SOLN
0.0000 ug/min | INTRAVENOUS | Status: DC
  Administered 2019-09-01: 10 ug/min via INTRAVENOUS
  Administered 2019-09-02: 35 ug/min via INTRAVENOUS
  Administered 2019-09-02: 30 ug/min via INTRAVENOUS
  Administered 2019-09-03: 70 ug/min via INTRAVENOUS
  Filled 2019-09-01 (×2): qty 250
  Filled 2019-09-01: qty 500
  Filled 2019-09-01 (×2): qty 250

## 2019-09-01 MED ORDER — LIDOCAINE 2% (20 MG/ML) 5 ML SYRINGE
INTRAMUSCULAR | Status: AC
  Filled 2019-09-01: qty 15

## 2019-09-01 MED ORDER — INSULIN ASPART 100 UNIT/ML ~~LOC~~ SOLN
0.0000 [IU] | Freq: Four times a day (QID) | SUBCUTANEOUS | Status: DC
  Administered 2019-09-02: 2 [IU] via SUBCUTANEOUS
  Administered 2019-09-02: 4 [IU] via SUBCUTANEOUS
  Administered 2019-09-02: 2 [IU] via SUBCUTANEOUS
  Administered 2019-09-02: 4 [IU] via SUBCUTANEOUS
  Administered 2019-09-03 – 2019-09-04 (×3): 2 [IU] via SUBCUTANEOUS
  Administered 2019-09-05: 4 [IU] via SUBCUTANEOUS
  Administered 2019-09-05: 2 [IU] via SUBCUTANEOUS
  Administered 2019-09-05: 4 [IU] via SUBCUTANEOUS
  Administered 2019-09-05 – 2019-09-07 (×7): 2 [IU] via SUBCUTANEOUS
  Administered 2019-09-08: 4 [IU] via SUBCUTANEOUS
  Administered 2019-09-08 (×2): 2 [IU] via SUBCUTANEOUS

## 2019-09-01 MED ORDER — POTASSIUM CHLORIDE 10 MEQ/50ML IV SOLN
10.0000 meq | INTRAVENOUS | Status: AC
  Administered 2019-09-01 – 2019-09-02 (×3): 10 meq via INTRAVENOUS
  Filled 2019-09-01 (×3): qty 50

## 2019-09-01 MED ORDER — ROCURONIUM BROMIDE 10 MG/ML (PF) SYRINGE
PREFILLED_SYRINGE | INTRAVENOUS | Status: DC | PRN
  Administered 2019-09-01: 30 mg via INTRAVENOUS
  Administered 2019-09-01: 40 mg via INTRAVENOUS
  Administered 2019-09-01: 30 mg via INTRAVENOUS
  Administered 2019-09-01: 50 mg via INTRAVENOUS

## 2019-09-01 MED ORDER — HEMOSTATIC AGENTS (NO CHARGE) OPTIME
TOPICAL | Status: DC | PRN
  Administered 2019-09-01: 2 via TOPICAL

## 2019-09-01 MED ORDER — VANCOMYCIN HCL IN DEXTROSE 1-5 GM/200ML-% IV SOLN
1000.0000 mg | INTRAVENOUS | Status: AC
  Administered 2019-09-01: 1000 mg via INTRAVENOUS
  Filled 2019-09-01: qty 200

## 2019-09-01 MED ORDER — OXYCODONE HCL 5 MG PO TABS
5.0000 mg | ORAL_TABLET | ORAL | Status: DC | PRN
  Administered 2019-09-02 – 2019-09-09 (×15): 5 mg
  Filled 2019-09-01 (×16): qty 1

## 2019-09-01 MED ORDER — OXYCODONE-ACETAMINOPHEN 5-325 MG PO TABS: 1.0000 | ORAL_TABLET | ORAL | Status: DC | PRN

## 2019-09-01 MED ORDER — LACTATED RINGERS IV SOLN: INTRAVENOUS | Status: DC | PRN

## 2019-09-01 MED ORDER — TRAZODONE HCL 50 MG PO TABS
25.0000 mg | ORAL_TABLET | Freq: Every day | ORAL | Status: DC
  Administered 2019-09-01: 25 mg via ORAL
  Filled 2019-09-01: qty 1

## 2019-09-01 MED ORDER — CHLORHEXIDINE GLUCONATE 0.12 % MT SOLN
OROMUCOSAL | Status: AC
  Filled 2019-09-01: qty 15

## 2019-09-01 MED ORDER — ROCURONIUM BROMIDE 10 MG/ML (PF) SYRINGE
PREFILLED_SYRINGE | INTRAVENOUS | Status: AC
  Filled 2019-09-01: qty 30

## 2019-09-01 MED ORDER — FENTANYL CITRATE (PF) 250 MCG/5ML IJ SOLN
INTRAMUSCULAR | Status: AC
  Filled 2019-09-01: qty 5

## 2019-09-01 MED ORDER — DEXMEDETOMIDINE HCL IN NACL 400 MCG/100ML IV SOLN
0.0000 ug/kg/h | INTRAVENOUS | Status: DC
  Administered 2019-09-01 – 2019-09-02 (×2): 0.4 ug/kg/h via INTRAVENOUS
  Filled 2019-09-01 (×2): qty 100

## 2019-09-01 SURGICAL SUPPLY — 78 items
ADH SKN CLS APL DERMABOND .7 (GAUZE/BANDAGES/DRESSINGS) ×2
BAG DECANTER FOR FLEXI CONT (MISCELLANEOUS) IMPLANT
BLADE CLIPPER SURG (BLADE) ×4 IMPLANT
BLADE SURG 11 STRL SS (BLADE) ×4 IMPLANT
CANISTER SUCT 3000ML PPV (MISCELLANEOUS) ×4 IMPLANT
CATH KIT ON-Q SILVERSOAK 5 (CATHETERS) IMPLANT
CATH KIT ON-Q SILVERSOAK 5IN (CATHETERS) IMPLANT
CATH ROBINSON RED A/P 22FR (CATHETERS) IMPLANT
CATH THORACIC 28FR (CATHETERS) ×2 IMPLANT
CATH THORACIC 28FR RT ANG (CATHETERS) IMPLANT
CATH THORACIC 36FR (CATHETERS) IMPLANT
CNTNR URN SCR LID CUP LEK RST (MISCELLANEOUS) ×4 IMPLANT
CONN ST 1/4X3/8  BEN (MISCELLANEOUS) ×4
CONN ST 1/4X3/8 BEN (MISCELLANEOUS) IMPLANT
CONN Y 3/8X3/8X3/8  BEN (MISCELLANEOUS) ×4
CONN Y 3/8X3/8X3/8 BEN (MISCELLANEOUS) IMPLANT
CONT SPEC 4OZ STRL OR WHT (MISCELLANEOUS) ×8
DERMABOND ADVANCED (GAUZE/BANDAGES/DRESSINGS) ×2
DERMABOND ADVANCED .7 DNX12 (GAUZE/BANDAGES/DRESSINGS) IMPLANT
DRAPE CV SPLIT W-CLR ANES SCRN (DRAPES) ×2 IMPLANT
DRAPE LAPAROSCOPIC ABDOMINAL (DRAPES) ×4 IMPLANT
DRAPE ORTHO SPLIT 77X108 STRL (DRAPES) ×4
DRAPE SURG ORHT 6 SPLT 77X108 (DRAPES) IMPLANT
DRAPE WARM FLUID 44X44 (DRAPES) ×4 IMPLANT
ELECT REM PT RETURN 9FT ADLT (ELECTROSURGICAL) ×4
ELECTRODE REM PT RTRN 9FT ADLT (ELECTROSURGICAL) ×2 IMPLANT
GAUZE SPONGE 4X4 12PLY STRL (GAUZE/BANDAGES/DRESSINGS) ×4 IMPLANT
GAUZE XEROFORM 1X8 LF (GAUZE/BANDAGES/DRESSINGS) ×2 IMPLANT
GLOVE BIO SURGEON STRL SZ 6.5 (GLOVE) ×1 IMPLANT
GLOVE BIO SURGEONS STRL SZ 6.5 (GLOVE) ×1
GLOVE BIOGEL M STRL SZ7.5 (GLOVE) ×4 IMPLANT
GLOVE BIOGEL PI IND STRL 8 (GLOVE) IMPLANT
GLOVE BIOGEL PI INDICATOR 8 (GLOVE) ×2
GOWN STRL REUS W/ TWL LRG LVL3 (GOWN DISPOSABLE) ×6 IMPLANT
GOWN STRL REUS W/TWL LRG LVL3 (GOWN DISPOSABLE) ×12
KIT BASIN OR (CUSTOM PROCEDURE TRAY) ×4 IMPLANT
KIT SUCTION CATH 14FR (SUCTIONS) ×4 IMPLANT
KIT TURNOVER KIT B (KITS) ×4 IMPLANT
NS IRRIG 1000ML POUR BTL (IV SOLUTION) ×16 IMPLANT
PACK CHEST (CUSTOM PROCEDURE TRAY) ×4 IMPLANT
PAD ARMBOARD 7.5X6 YLW CONV (MISCELLANEOUS) ×8 IMPLANT
PROGEL SPRAY TIP 11IN (MISCELLANEOUS) ×8
SEALANT PROGEL (MISCELLANEOUS) ×4 IMPLANT
SEALANT SURG COSEAL 4ML (VASCULAR PRODUCTS) IMPLANT
SOL ANTI FOG 6CC (MISCELLANEOUS) ×2 IMPLANT
SOLUTION ANTI FOG 6CC (MISCELLANEOUS) ×2
SPONGE LAP 18X18 X RAY DECT (DISPOSABLE) ×2 IMPLANT
SPONGE TONSIL TAPE 1 RFD (DISPOSABLE) ×2 IMPLANT
SUT CHROMIC 3 0 SH 27 (SUTURE) IMPLANT
SUT ETHILON 3 0 PS 1 (SUTURE) ×8 IMPLANT
SUT PROLENE 3 0 SH DA (SUTURE) IMPLANT
SUT PROLENE 4 0 RB 1 (SUTURE)
SUT PROLENE 4-0 RB1 .5 CRCL 36 (SUTURE) IMPLANT
SUT PROLENE 6 0 C 1 30 (SUTURE) IMPLANT
SUT SILK  1 MH (SUTURE) ×8
SUT SILK 1 MH (SUTURE) ×4 IMPLANT
SUT SILK 1 TIES 10X30 (SUTURE) IMPLANT
SUT SILK 2 0SH CR/8 30 (SUTURE) IMPLANT
SUT SILK 3 0SH CR/8 30 (SUTURE) ×2 IMPLANT
SUT VIC AB 1 CTX 18 (SUTURE) ×2 IMPLANT
SUT VIC AB 2 TP1 27 (SUTURE) ×4 IMPLANT
SUT VIC AB 2-0 CT2 18 VCP726D (SUTURE) ×2 IMPLANT
SUT VIC AB 2-0 CTX 36 (SUTURE) IMPLANT
SUT VIC AB 3-0 SH 18 (SUTURE) IMPLANT
SUT VIC AB 3-0 X1 27 (SUTURE) ×2 IMPLANT
SUT VICRYL 0 UR6 27IN ABS (SUTURE) IMPLANT
SUT VICRYL 2 TP 1 (SUTURE) IMPLANT
SWAB COLLECTION DEVICE MRSA (MISCELLANEOUS) IMPLANT
SWAB CULTURE ESWAB REG 1ML (MISCELLANEOUS) IMPLANT
SYSTEM SAHARA CHEST DRAIN ATS (WOUND CARE) ×4 IMPLANT
TAPE CLOTH SURG 4X10 WHT LF (GAUZE/BANDAGES/DRESSINGS) ×2 IMPLANT
TIP APPLICATOR SPRAY EXTEND 16 (VASCULAR PRODUCTS) ×2 IMPLANT
TIP SPRAY PROGEL 11IN (MISCELLANEOUS) IMPLANT
TOWEL GREEN STERILE (TOWEL DISPOSABLE) ×4 IMPLANT
TOWEL GREEN STERILE FF (TOWEL DISPOSABLE) ×4 IMPLANT
TRAP SPECIMEN MUCUS 40CC (MISCELLANEOUS) ×2 IMPLANT
TRAY FOLEY MTR SLVR 16FR STAT (SET/KITS/TRAYS/PACK) ×4 IMPLANT
WATER STERILE IRR 1000ML POUR (IV SOLUTION) ×8 IMPLANT

## 2019-09-01 NOTE — Consult Note (Signed)
[address]       [place] [address]             [phone]        Prashant W Bazzano Alba Medical Record [id number] Date of Birth: 25-Nov-1950  Referring: [facility] hospitalists  primary Care: Laurey Morale, MD Primary Cardiologist:No primary care provider on file.  Chief Complaint: Chest pain and shortness of breath  History of Present Illness:     Patient examined, images of chest x-ray and CT scan of chest personally reviewed and discussed with patient. Chronically ill malnourished 69 year old male status post right mandibular resection with right neck dissection for malignancy 9 years ago with permanent gastrostomy feeding tube transferred from [facility] for empyema.  He presented there earlier in the week with chest discomfort and shortness of breath and weight loss.  A thoracentesis was performed there which drained 300 cc of purulent material.  Conventional radiology then placed a 12 French right pigtail catheter in the right pleural space 2 days ago which drained another 100 cc.  He has been placed on antibiotics, no culture data are available from the outside hospital.  Follow-up chest x-ray shows improvement of the empyema but entrapment of the right lung and persistent loculated fluid in the right subpulmonic space despite the presence of the pigtail catheter.  The patient is not toxic and hemodynamically stable although with severe protein malnutrition and frailty. Right VATS for drainage of empyema and decortication right lung are the best chance for recovery for this unfortunate patient.  This will be at increased risk but without surgery the process will continue to remain indolent with further progression and very poor prognosis.  Discussed the procedure in detail with the patient and he agrees to proceed with right VATS for drainage of empyema under general anesthesia.  He understands that blood transfusion therapy may be necessary and that  he will be in the ICU.  He has a functional gastrostomy tube and has been n.p.o. since last night.   Current Activity/ Functional Status: Lives at home with his wife.  Able to walk. Zubrod Score: At the time of surgery this patients most appropriate activity status/level should be described as: []     0    Normal activity, no symptoms []     1    Restricted in physical strenuous activity but ambulatory, able to do out light work []     2    Ambulatory and capable of self care, unable to do work activities, up and about                 more than 50%  Of the time                            [x]     3    Only limited self care, in bed greater than 50% of waking hours []     4    Completely disabled, no self care, confined to bed or chair []     5    Moribund  Past Medical History:  Diagnosis Date   Cancer ([place])    tongue and neck   Hepatitis C     Past Surgical History:  Procedure Laterality Date   OTHER SURGICAL HISTORY  06/01/2012   part of tongue removed and reconstructed    Social History   Tobacco Use  Smoking Status Former Smoker  Smokeless Tobacco  Never Used    Social History   Substance and Sexual Activity  Alcohol Use No     Allergies  Allergen Reactions   Codeine     REACTION: vomitting    Current Facility-Administered Medications  Medication Dose Route Frequency Provider Last Rate Last Admin   0.9 %  sodium chloride infusion   Intravenous Continuous Shalhoub, Sherryll Burger, MD 100 mL/hr at 09/01/19 0324 New Bag at 09/01/19 0324   acetaminophen (TYLENOL) tablet 650 mg  650 mg Oral Q6H PRN Amin, Ankit Chirag, MD       dextromethorphan-guaiFENesin (Sasser DM) 30-600 MG per 12 hr tablet 1 tablet  1 tablet Oral BID Amin, Jeanella Flattery, MD       [START ON 09/02/2019] DULoxetine (CYMBALTA) DR capsule 60 mg  60 mg Oral Daily Shalhoub, Sherryll Burger, MD       [START ON 09/02/2019] ferrous sulfate 220 (44 Fe) MG/5ML solution 300 mg  300 mg Per Tube Q breakfast Shalhoub,  Sherryll Burger, MD       insulin aspart (novoLOG) injection 0-9 Units  0-9 Units Subcutaneous Q4H Shalhoub, Sherryll Burger, MD       ipratropium-albuterol (DUONEB) 0.5-2.5 (3) MG/3ML nebulizer solution 3 mL  3 mL Nebulization Q4H PRN Amin, Ankit Chirag, MD       meropenem (MERREM) 1 g in sodium chloride 0.9 % 100 mL IVPB  1 g Intravenous Q8H Franky Macho, RPH 200 mL/hr at 09/01/19 0327 1 g at 09/01/19 0327   oxyCODONE (ROXICODONE) 5 MG/5ML solution 5 mg  5 mg Per Tube Q4H PRN Shalhoub, Sherryll Burger, MD       Or   morphine 2 MG/ML injection 2 mg  2 mg Intravenous Q4H PRN Shalhoub, Sherryll Burger, MD   2 mg at 09/01/19 0101   ondansetron (ZOFRAN) tablet 4 mg  4 mg Oral Q6H PRN Shalhoub, Sherryll Burger, MD       Or   ondansetron (ZOFRAN) injection 4 mg  4 mg Intravenous Q6H PRN Shalhoub, Sherryll Burger, MD       polyethylene glycol (MIRALAX / GLYCOLAX) packet 17 g  17 g Oral Daily PRN Amin, Ankit Chirag, MD       senna-docusate (Senokot-S) tablet 2 tablet  2 tablet Oral QHS PRN Amin, Ankit Chirag, MD       traZODone (DESYREL) tablet 25 mg  25 mg Oral QHS Shalhoub, Sherryll Burger, MD   25 mg at 09/01/19 0101   vancomycin (VANCOREADY) IVPB 750 mg/150 mL  750 mg Intravenous Q12H Franky Macho, RPH 150 mL/hr at 09/01/19 0423 750 mg at 09/01/19 0423    Medications Prior to Admission  Medication Sig Dispense Refill Last Dose   Alum & Mag Hydroxide-Simeth (GI COCKTAIL) SUSP suspension Swish and spit 5 mLs 4 (four) times daily as needed (for pain). Shake well.      Alum & Mag Hydroxide-Simeth (MAGIC MOUTHWASH) SOLN Swish and spit 10 mLs 4 (four) times daily as needed (for thrush).      fentaNYL (DURAGESIC - DOSED MCG/HR) 25 MCG/HR Place 1 patch onto the skin every 3 (three) days.      ondansetron (ZOFRAN) 4 MG tablet Take 1 tablet (4 mg total) by mouth every 6 (six) hours. 20 tablet 0    oxyCODONE (ROXICODONE) 5 MG/5ML solution Take 5-10 mg by mouth every 4 (four) hours as needed for pain.      prochlorperazine (COMPAZINE)  10 MG tablet Give 10 mg by tube every 6 (six) hours  as needed (for nausea/vomiting).       Family History  Problem Relation Age of Onset   Pancreatic cancer Mother    Diabetes Father    CAD Father      Review of Systems:   ROS  Right-hand-dominant No history of previous thoracic surgery Weight has been decreasing with less than 5 pound weight loss He cannot swallow at all and his speech is difficult to understand   Cardiac Review of Systems: Y or  [    ]= no  Chest Pain [ y   ]  Resting SOB [   ] Exertional SOB  Blue.Reese  ]  Orthopnea [  ]   Pedal Edema [   ]    Palpitations [  ] Syncope  [  ]   Presyncope [   ]  General Review of Systems: [Y] = yes [  ]=no Constitional: recent weight change Blue.Reese  ]; anorexia [  ]; fatigue Blue.Reese  ]; nausea [  ]; night sweats [  ]; fever [  ]; or chills [  ]                                                               Dental: Last Dentist visit:   Eye : blurred vision [  ]; diplopia [   ]; vision changes [  ];  Amaurosis fugax[  ]; Resp: cough [ y ];  wheezing[  ];  hemoptysis[  ]; shortness of breath[  ]; paroxysmal nocturnal dyspnea[  ]; dyspnea on exertion[y  ]; or orthopnea[  ];  GI:  gallstones[  ], vomiting[  ];  dysphagia[  ]; melena[  ];  hematochezia [  ]; heartburn[  ];   Hx of  Colonoscopy[  ]; GU: kidney stones [  ]; hematuria[  ];   dysuria [  ];  nocturia[  ];  history of     obstruction [  ]; urinary frequency [  ]             Skin: rash, swelling[  ];, hair loss[  ];  peripheral edema[  ];  or itching[  ]; Musculosketetal: myalgias[  ];  joint swelling[  ];  joint erythema[  ];  joint pain[  ];  back pain[  ];  Heme/Lymph: bruising[  ];  bleeding[  ];  anemia[  ];  Neuro: TIA[  ];  headaches[  ];  stroke[  ];  vertigo[  ];  seizures[  ];   paresthesias[  ];  difficulty walking[  ];  Psych:depression[  ]; anxiety[  ];  Endocrine: diabetes[ y ];  thyroid dysfunction[  ];                 Physical Exam: BP 135/88 (BP Location: Left Arm)     Pulse 77    Temp 98.4 F (36.9 C) (Oral)    Resp 17    Ht 5\' 9"  (1.753 m)    Wt 58.9 kg    SpO2 97%    BMI 19.18 kg/m       Physical Exam  General: Frail malnourished  male who appears chronically ill HEENT: Normocephalic pupils equal , dentition adequate Neck: Supple without JVD, adenopathy, or bruit Chest: Pigtail catheter in right chest with  purulent material draining and diminished breath sounds on the right side.  , no rhonchi, no tenderness             or deformity Cardiovascular: Regular rate and rhythm, no murmur, no gallop, peripheral pulses             palpable in all extremities Abdomen:  Soft, nontender, no palpable mass or organomegaly Extremities: Warm, well-perfused, no clubbing cyanosis edema or tenderness,              no venous stasis changes of the legs Rectal/GU: Deferred Neuro: Grossly non--focal and symmetrical throughout Skin: Clean and dry without rash or ulceration    Diagnostic Studies & Laboratory data:     Recent Radiology Findings:   CT CHEST W CONTRAST  Result Date: 09/01/2019 CLINICAL DATA:  Abnormal chest radiograph, pleural effusion, empyema EXAM: CT CHEST WITH CONTRAST TECHNIQUE: Multidetector CT imaging of the chest was performed during intravenous contrast administration. CONTRAST:  21mL OMNIPAQUE IOHEXOL 300 MG/ML  SOLN COMPARISON:  CT 08/30/2019 FINDINGS: Cardiovascular: Upper limits normal cardiac size. Three-vessel coronary artery disease is noted. Small pericardial effusion, stable from prior. Atherosclerotic plaque within the normal caliber aorta. Three vessel branching of the aortic arch with minimal plaque in the proximal subclavian arteries. Remaining great vessels are unremarkable. Central pulmonary arteries are normal caliber. Contrast bolus is inadequate for assessment of pulmonary emboli. Mediastinum/Nodes: No mediastinal fluid or gas. Normal thyroid gland and thoracic inlet. There are extensive secretions throughout the airways including  abrupt narrowing of the central airways to the right middle and lower lobe (3/80, 3/72). High attenuation contrast material within the thoracic esophagus, correlate for reflux. Scattered low-attenuation subcentimeter mediastinal and hilar nodes are likely reactive. No worrisome mediastinal, hilar or axillary adenopathy. Lungs/Pleura: There is a right pigtail pleural drain entering at the sixth interspace, mid axillary line and positioned posteriorly in the mid to lower lung within a large, thick-walled air and fluid containing collection compatible with partially drained empyema seen previously. Overall size of this collection has significantly decreased from prior exam but does remain moderate in overall extent. There is extensive visceral and parietal pleural thickening is noted throughout the right hemithorax. Concave margins of the an expanded lung may suggest some in trapped lung with failed re-expansion secondary to the active pleural inflammation. There is extensive residual passive atelectasis throughout the right hemithorax including near complete atelectatic collapse of the right lower lobe. Associated diffuse airways thickening is again seen. There scattered areas of peripheral ground-glass in the right middle and upper lobe. A small volume left pleural effusion without complicating pleural thickening is noted. Several fluid-filled lower lobe airways are present as well in the left lower lobe with adjacent peribronchovascular opacity likely reflecting developing consolidation/inflammation. Upper Abdomen: Mild periportal edema within the liver as well as small volume perihepatic ascites. Heterogeneous attenuation of the spleen may be related to contrast timing. Musculoskeletal: Multilevel degenerative changes are present in the imaged portions of the spine. No acute osseous abnormality or suspicious osseous lesion. Moderate circumferential body wall edema. No suspicious chest wall lesions. IMPRESSION: 1.  Right pigtail pleural drain is present within a large, thick-walled air and fluid containing collection compatible with partially drained empyema seen previously. Overall size of this collection has significantly decreased from prior exam but does remain moderate in overall extent with increasing air component. 2. Extensive visceral and parietal pleural thickening throughout the right hemithorax with concave margins of the incompletely reexpanded lung may suggest some lung entrapment with  failed re-expansion secondary to the active pleural inflammation. 3. Extensive passive atelectasis throughout the right hemithorax, underlying consolidation is not excluded. 4. Small volume left pleural effusion without complicating pleural thickening. 5. Extensive fluid secretions throughout the airways predominantly in the right mid to lower lung with narrowing of several middle and lower lobe central airways and with multiple fluid filled airways in the left lung base as well as developing peribronchial consolidation concerning for sequela of aspiration. 6. Persistent patchy ground-glass opacities in right middle and upper lobes also likely reflect some postobstructive pneumonitis or infection. 7. Features of anasarca with periportal edema, circumferential body wall edema and small volume ascites. 8. Aortic Atherosclerosis (ICD10-I70.0). 9. Coronary artery calcifications are present. Please note that the presence of coronary artery calcium documents the presence of coronary artery disease, the severity of this disease and any potential stenosis cannot be assessed on this non-gated CT examination. Assessment for potential risk factor modification, dietary therapy or pharmacologic therapy may be warranted. Electronically Signed   By: Lovena Le M.D.   On: 09/01/2019 02:26   DG Chest Port 1 View  Result Date: 09/01/2019 CLINICAL DATA:  Follow-up empyema. EXAM: PORTABLE CHEST 1 VIEW COMPARISON:  Chest CT from earlier today at  1:43 a.m. FINDINGS: Right chest tube is identified and appears unchanged in position from previous exam. No change volume of loculated empyema containing air overlying the right lung. Diffuse hazy lung opacities within the right lung appears similar to previous exam. Left lung clear. IMPRESSION: 1. No change in volume of loculated empyema containing air overlying the right lung. 2. Stable position of right chest tube. Electronically Signed   By: Kerby Moors M.D.   On: 09/01/2019 08:46     I have independently reviewed the above radiologic studies and discussed with the patient   Recent Lab Findings: Lab Results  Component Value Date   WBC 10.9 (H) 09/01/2019   HGB 11.9 (L) 09/01/2019   HCT 36.8 (L) 09/01/2019   PLT 580 (H) 09/01/2019   GLUCOSE 118 (H) 09/01/2019   ALT 24 09/01/2019   AST 35 09/01/2019   NA 127 (L) 09/01/2019   K 3.3 (L) 09/01/2019   CL 84 (L) 09/01/2019   CREATININE 0.55 (L) 09/01/2019   BUN <5 (L) 09/01/2019   CO2 34 (H) 09/01/2019   TSH 9.442 (H) 09/01/2019   INR 1.1 09/01/2019   HGBA1C 8.0 (H) 09/01/2019      Assessment / Plan:      Probable aspiration pneumonia to the right lower lobe with empyema which has been incompletely drained with CT-guided catheter  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  The procedure benefits and risks have been discussed with patient and informed consent obtained.    09/01/2019 9:11 AM

## 2019-09-01 NOTE — Progress Notes (Signed)
Pharmacy Antibiotic Note  Brandon Miles is a 69 y.o. male admitted on 08/31/2019 with empyema.  Pharmacy has been consulted for Vancomycin and Meropenem dosing.  Oval Linsey called to give pt's ABX regimen: Vanc 1.5g given 5/25 then 1g Q12h (rec'd 2 doses, last dose 5/26 1231) Zosyn 4.5g Q6 (last dose 5/26 2015)  Plan: Meropenem 1gm IV q8h Vancomycin 750 mg IV Q 12 hrs. Will f/u renal function, micro data, and pt's clinical condition Vanc trough prn   Height: 5\' 9"  (175.3 cm) Weight: 58.9 kg (129 lb 13.6 oz) IBW/kg (Calculated) : 70.7  Temp (24hrs), Avg:98.4 F (36.9 C), Min:98.4 F (36.9 C), Max:98.4 F (36.9 C)  Recent Labs  Lab 09/01/19 0017  WBC 10.9*  CREATININE 0.59*  LATICACIDVEN 1.1    Estimated Creatinine Clearance: 73.6 mL/min (A) (by C-G formula based on SCr of 0.59 mg/dL (L)).    Allergies  Allergen Reactions  . Codeine     REACTION: vomitting    Antimicrobials this admission: 5/25 Oval Linsey) Zosyn >>5/27 5/25 (started at [facility]) Vancomycin >>  5/27 Meropenem >>    Microbiology results: Pending  Thank you for allowing pharmacy to be a part of this patient's care.  Sherlon Handing, PharmD, BCPS Please see amion for complete clinical pharmacist phone list 09/01/2019 3:01 AM

## 2019-09-01 NOTE — Brief Op Note (Signed)
08/31/2019 - 09/01/2019  5:24 PM  PATIENT:  Brandon Miles  69 y.o. male  PRE-OPERATIVE DIAGNOSIS:  RIGHT EMPYEMA  POST-OPERATIVE DIAGNOSIS:  RIGHT EMPYEMA  PROCEDURE:  Procedure(s): -VIDEO ASSISTED THORACOSCOPY (VATS) -RIGHT THORACOTOMY -DRAINAGE OF EMPYEMA  AND PLEURAL DECORTICATION RIGHT LUNG -INSERTION OF CENTRAL LINE  SURGEON:  [place] Gum, Collier Salina, MD   PHYSICIAN ASSISTANT: Molley Houser  ANESTHESIA:   general  EBL:  100 mL   BLOOD ADMINISTERED:1 unit PRBC's  DRAINS: Right pleural tubes x 2.  LOCAL MEDICATIONS USED:  NONE  SPECIMEN:  Source of Specimen:  Pleural fluid  DISPOSITION OF SPECIMEN:  Microbiology  COUNTS:  YES  DICTATION: .Dragon Dictation  PLAN OF CARE: Admit to inpatient   PATIENT DISPOSITION:  ICU - intubated and hemodynamically stable.   Delay start of Pharmacological VTE agent (>24hrs) due to surgical blood loss or risk of bleeding: yes

## 2019-09-01 NOTE — Progress Notes (Addendum)
Pt arrived to ICU on ventilator. Pt placed on monitor. Bedside report received from [facility]. IV drips verified. Family in waiting room and updated. Skin assessed. Multiple scabbed abrasions noted to pt forehead, nose, and right eye lid. Chest tubes placed to suction, G-tube to gravity drain in urometer collect bag.

## 2019-09-01 NOTE — Progress Notes (Signed)
Initial Nutrition Assessment  DOCUMENTATION CODES:   Severe malnutrition in context of chronic illness  INTERVENTION:   Begin bolus tube feeds via PEG on 09/02/19: - 2 cartons/ARCs (474 ml) of Glucerna 1.5 cal formula QID at 1000, 1200, 1500, and 1800 (total of 8 cartons/ARCs) - Flush PEG with 50 ml water before and after each feed  - Additional free water flushes of 100 ml 5 times daily at 0800, 1100, 1400, 1600, and 2000  Tube feeding regimen and free water flushes provide 2844 kcal, 157 grams of protein, and 2339 ml of H2O.   - Liquid MVI daily per tube  NUTRITION DIAGNOSIS:   Severe Malnutrition related to chronic illness (throat cancer s/p radiation and surgery) as evidenced by severe fat depletion, severe muscle depletion.  GOAL:   Patient will meet greater than or equal to 90% of their needs  MONITOR:   Labs, Weight trends, TF tolerance, Skin, I & O's  REASON FOR ASSESSMENT:   Consult Enteral/tube feeding initiation and management  ASSESSMENT:   69 year old male who presented on 5/27 with a right-sided empyema. 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   Noted plan for right VATS for drainage of empyema and decortication of right lung later today. Spoke with TCTS regarding start time of tube feeds. TCTS MD would like to wait to restart tube feeds tomorrow AM. Communicated this with attending MD.  Met with pt at bedside. Pt communicated with RD via pen and paper regarding his home tube feeding regimen: 1000: 2 cartons vanilla Boost Plus 1200: 2 cartons vanilla Boost Plus 1500: 2 cartons vanilla Boost Plus 1800: 2 cartons vanilla Boost Plus  Home tube feeding regimen provides 2880 kcal and 112 grams of protein.  Pt takes a multivitamin per tube in the morning. Pt does not take any POs.  Pt reports administering his tube feeds as listed  above and endorses weight loss. Pt reports his UBW as 135 lbs. Current weight is 129 lbs. Unsure of timeframe of weight loss as weight history in chart is very limited.  RD will adjust tube feeding regimen to a diabetic formula. Will order to start tomorrow along with free water flushes and liquid MVI.  Medications reviewed and include: ferrous sulfate, SSI q 4 hours, IV abx IVF: NS @ 100 ml/hr  Labs reviewed: sodium 127, potassium 3.3 CBG's: 119, 150  NUTRITION - FOCUSED PHYSICAL EXAM:    Most Recent Value  Orbital Region  Severe depletion  Upper Arm Region  Severe depletion  Thoracic and Lumbar Region  Severe depletion  Buccal Region  Severe depletion  Temple Region  Severe depletion  Clavicle Bone Region  Severe depletion  Clavicle and Acromion Bone Region  Severe depletion  Scapular Bone Region  Severe depletion  Dorsal Hand  Severe depletion  Patellar Region  Severe depletion  Anterior Thigh Region  Severe depletion  Posterior Calf Region  Severe depletion  Edema (RD Assessment)  None  Hair  Reviewed  Eyes  Reviewed  Mouth  Reviewed  Skin  Reviewed  Nails  Reviewed       Diet Order:   Diet Order            Diet NPO time specified  Diet effective midnight        Diet NPO time specified  Diet effective now  EDUCATION NEEDS:   No education needs have been identified at this time  Skin:  Skin Assessment: Reviewed RN Assessment (PEG tube)  Last BM:  08/30/19  Height:   Ht Readings from Last 1 Encounters:  08/31/19 '5\' 9"'$  (1.753 m)    Weight:   Wt Readings from Last 1 Encounters:  08/31/19 58.9 kg    Ideal Body Weight:  72.7 kg  BMI:  Body mass index is 19.18 kg/m.  Estimated Nutritional Needs:   Kcal:  2500-2800  Protein:  125-150 grams  Fluid:  >/= 2.2 L    Gaynell Face, MS, RD, LDN Inpatient Clinical Dietitian Pager: [phone] Weekend/After Hours: [phone]

## 2019-09-01 NOTE — Anesthesia Procedure Notes (Signed)
Arterial Line Insertion Start/End5/27/2021 2:55 PM, 09/01/2019 3:00 PM Performed by: Teressa Lower., CRNA, CRNA  Patient location: Pre-op. Preanesthetic checklist: patient identified, IV checked, site marked, risks and benefits discussed, surgical consent, monitors and equipment checked, pre-op evaluation, timeout performed and anesthesia consent Lidocaine 1% used for infiltration Left, radial was placed Catheter size: 20 G Hand hygiene performed , maximum sterile barriers used  and Seldinger technique used Allen's test indicative of satisfactory collateral circulation Attempts: 1 Procedure performed without using ultrasound guided technique. Following insertion, dressing applied and Biopatch. Post procedure assessment: normal and unchanged  Patient tolerated the procedure well with no immediate complications.

## 2019-09-01 NOTE — Transfer of Care (Signed)
Immediate Anesthesia Transfer of Care Note  Patient: Brandon Miles  Procedure(s) Performed: VIDEO ASSISTED THORACOSCOPY (VATS)/EMPYEMA  AND DECORTICATION (Right Chest) Insertion Central Line Adult (Left Neck)  Patient Location: ICU  Anesthesia Type:General  Level of Consciousness: unresponsive and Patient remains intubated per anesthesia plan  Airway & Oxygen Therapy: Patient remains intubated per anesthesia plan and Patient placed on Ventilator (see vital sign flow sheet for setting)  Post-op Assessment: Report given to RN and Post -op Vital signs reviewed and stable  Post vital signs: Reviewed and stable  Last Vitals:  Vitals Value Taken Time  BP 102/66 09/01/19 1813  Temp    Pulse    Resp 16 09/01/19 1815  SpO2    Vitals shown include unvalidated device data.  Last Pain:  Vitals:   09/01/19 1300  TempSrc:   PainSc: 8          Complications: No apparent anesthesia complications

## 2019-09-01 NOTE — Progress Notes (Addendum)
ABG results (See Results) called in to Dr. Roxan Hockey. Verbal order given to turn patient vent R rate from 12 to 14. Respiratory Therapist notified of changes. MD also notified of patients K+ of 3.3 on ISTAT with order for continuous 20 meq in 0.9% NaCl active and running. Verbal order for TCTS protocol given for 3 runs of KCl.

## 2019-09-01 NOTE — Plan of Care (Signed)
  Problem: Cardiac: Goal: Will achieve and/or maintain hemodynamic stability Outcome: Progressing   Problem: Clinical Measurements: Goal: Postoperative complications will be avoided or minimized Outcome: Progressing   Problem: Respiratory: Goal: Respiratory status will improve Outcome: Progressing   Problem: Pain Management: Goal: Pain level will decrease Outcome: Progressing   

## 2019-09-01 NOTE — Anesthesia Procedure Notes (Signed)
Procedure Name: Intubation Date/Time: 09/01/2019 3:33 PM Performed by: Candis Shine, CRNA Pre-anesthesia Checklist: Patient identified, Emergency Drugs available, Suction available and Patient being monitored Patient Re-evaluated:Patient Re-evaluated prior to induction Oxygen Delivery Method: Circle System Utilized Preoxygenation: Pre-oxygenation with 100% oxygen Induction Type: IV induction Laryngoscope Size: Glidescope and 3 Grade View: Grade I Tube type: Oral Tube size: 8.5 mm Number of attempts: 1 Airway Equipment and Method: Video-laryngoscopy and Rigid stylet Placement Confirmation: ETT inserted through vocal cords under direct vision,  positive ETCO2 and breath sounds checked- equal and bilateral Secured at: 22 cm Tube secured with: Tape Dental Injury: Teeth and Oropharynx as per pre-operative assessment  Difficulty Due To: Difficulty was anticipated and Difficult Airway- due to limited oral opening Comments: Elective glidescope intubation d/t previous hx of cancer/radiation to throat and neck and limited oral opening. Atraumatic oral intubation with glidescope 3, grade I view.

## 2019-09-01 NOTE — Progress Notes (Addendum)
Inpatient Diabetes Program Recommendations  AACE/ADA: New Consensus Statement on Inpatient Glycemic Control (2015)  Target Ranges:  Prepandial:   less than 140 mg/dL      Peak postprandial:   less than 180 mg/dL (1-2 hours)      Critically ill patients:  140 - 180 mg/dL   Lab Results  Component Value Date   GLUCAP 119 (H) 09/01/2019   HGBA1C 8.0 (H) 09/01/2019    Review of Glycemic Control Results for YOREL, REDDER (MRN [id number]) as of 09/01/2019 09:38  Ref. Range 09/01/2019 03:14 09/01/2019 08:42  Glucose-Capillary Latest Ref Range: 70 - 99 mg/dL 150 (H) 119 (H)   Diabetes history: New onset DM Outpatient Diabetes medications: none Current orders for Inpatient glycemic control: Novolog 0-9 units Q4H  Inpatient Diabetes Program Recommendations:    Noted consult.  Patient currently NPO, has permament PEG.  In agreement with current orders.  LWWDM, care instruction and dietitian consults placed. Will plan to see when appropriate.  Will need a blood glucose meter at discharge. Blood glucose meter kit (includes lancets and strips)(#[id number]).  Addendum: Attempted to speak with patient, shakes his head "no". Will plan to see patient when more appropriate.   Thanks, Bronson Curb, MSN, RNC-OB Diabetes Coordinator [phone] (8a-5p)

## 2019-09-01 NOTE — Progress Notes (Signed)
PROGRESS NOTE    Brandon Miles  [id number] DOB: 1950-08-04 DOA: 08/31/2019 PCP: Laurey Morale, MD   Brief Narrative:  69 year old with history of hep C, throat cancer status post radiation, surgery, PEG tube placement presented from [facility] for right-sided empyema.  Had progressive shortness of breath over past few weeks, underwent outpatient right-sided thoracentesis by IR, 300 cc of fluid was removed and sent to [facility] showed thickened walled right-sided pleural effusion with compressive atelectasis, right middle and upper lobe groundglass opacities, started on vancomycin and Zosyn.  Chest tube was placed by IR at [facility], PEG tube was exchanged with a Foley catheter and transferred to [facility] for surgical consultation and decortication.   Assessment & Plan:   Principal Problem:   Empyema of right pleural space (HCC) Active Problems:   Uncontrolled type 2 diabetes mellitus with hyperglycemia, without long-term current use of insulin (HCC)   Hyponatremia   Severe protein-calorie malnutrition (HCC)  Right pleural space empyema with compressive atelectasis, middle and upper lobe groundglass opacity -On vancomycin and meropenem. -CT surgery has been notified, Dr. Darcey Nora. Plans for right-sided VATS for drainage of empyema with decortication of the right lung later today -Supplemental oxygen -Incentive spirometer, flutter valve  Insulin-dependent diabetes mellitus type 2, uncontrolled due to hyperglycemia -Hemoglobin A1c 8.0.  New diagnosis -Insulin sliding scale and Accu-Chek. -Diabetic coordinator  Hyponatremia, 128 upon admission -Normal saline infusion-100 cc/h  Severe protein calorie malnutrition -Nutrition consult.  Tube feeding via PEG tube-currently on hold.  Depression -On Cymbalta  Iron deficiency anemia -Iron supplements   DVT prophylaxis: SCDs Code Status: Full code Family Communication: None  Status is:  Inpatient  Remains inpatient appropriate because:Inpatient level of care appropriate due to severity of illness   Dispo: The patient is from: Home              Anticipated d/c is to: Home              Anticipated d/c date is: 3 days              Patient currently is not medically stable to d/c. Going for right sided drainage of empyema with decortication of lung later today.    Subjective: Does not have any complaints besides shortness of breath when he tries to move around or take a deep breath. Also reports of pain at the catheter insertion site.  Review of Systems Otherwise negative except as per HPI, including: General: Denies fever, chills, night sweats or unintended weight loss. Resp: Denies cough, wheezing, shortness of breath. Cardiac: Denies chest pain, palpitations, orthopnea, paroxysmal nocturnal dyspnea. GI: Denies abdominal pain, nausea, vomiting, diarrhea or constipation GU: Denies dysuria, frequency, hesitancy or incontinence MS: Denies muscle aches, joint pain or swelling Neuro: Denies headache, neurologic deficits (focal weakness, numbness, tingling), abnormal gait Psych: Denies anxiety, depression, SI/HI/AVH Skin: Denies new rashes or lesions ID: Denies sick contacts, exotic exposures, travel  Examination:  General exam: Appears chronically ill and frail Respiratory system: Diminished breath sounds on the right side. Cardiovascular system: S1 & S2 heard, RRR. No JVD, murmurs, rubs, gallops or clicks. No pedal edema. Gastrointestinal system: Abdomen is nondistended, soft and nontender. No organomegaly or masses felt. Normal bowel sounds heard. Central nervous system: Alert and oriented. No focal neurological deficits. Extremities: Symmetric 4 x 5 power. Skin: No rashes, lesions or ulcers Psychiatry: Judgement and insight appear Poor. Mood & affect appropriate.  Right chest wall catheter noted with purulent drainage Peg  tube in place  Objective: Vitals:    08/31/19 2320 09/01/19 0145  BP:  135/88  Pulse:  77  Resp:  17  Temp:  98.4 F (36.9 C)  TempSrc:  Oral  SpO2:  97%  Weight: 58.9 kg   Height: 5\' 9"  (1.753 m)     Intake/Output Summary (Last 24 hours) at 09/01/2019 0747 Last data filed at 09/01/2019 0423 Gross per 24 hour  Intake 292.6 ml  Output --  Net 292.6 ml   Filed Weights   08/31/19 2320  Weight: 58.9 kg     Data Reviewed:   CBC: Recent Labs  Lab 09/01/19 0017  WBC 10.9*  NEUTROABS 9.6*  HGB 11.9*  HCT 36.8*  MCV 88.2  PLT 123456*   Basic Metabolic Panel: Recent Labs  Lab 09/01/19 0017  NA 128*  K 3.5  CL 84*  CO2 37*  GLUCOSE 148*  BUN <5*  CREATININE 0.59*  CALCIUM 7.9*  MG 1.8   GFR: Estimated Creatinine Clearance: 73.6 mL/min (A) (by C-G formula based on SCr of 0.59 mg/dL (L)). Liver Function Tests: Recent Labs  Lab 09/01/19 0017  AST 35  ALT 24  ALKPHOS 75  BILITOT 0.6  PROT 6.1*  ALBUMIN 1.5*   No results for input(s): LIPASE, AMYLASE in the last 168 hours. No results for input(s): AMMONIA in the last 168 hours. Coagulation Profile: Recent Labs  Lab 09/01/19 0017  INR 1.1   Cardiac Enzymes: No results for input(s): CKTOTAL, CKMB, CKMBINDEX, TROPONINI in the last 168 hours. BNP (last 3 results) No results for input(s): PROBNP in the last 8760 hours. HbA1C: Recent Labs    09/01/19 0017  HGBA1C 8.0*   CBG: Recent Labs  Lab 09/01/19 0314  GLUCAP 150*   Lipid Profile: No results for input(s): CHOL, HDL, LDLCALC, TRIG, CHOLHDL, LDLDIRECT in the last 72 hours. Thyroid Function Tests: Recent Labs    09/01/19 0017  TSH 9.442*   Anemia Panel: No results for input(s): VITAMINB12, FOLATE, FERRITIN, TIBC, IRON, RETICCTPCT in the last 72 hours. Sepsis Labs: Recent Labs  Lab 09/01/19 0017  LATICACIDVEN 1.1    Recent Results (from the past 240 hour(s))  Surgical pcr screen     Status: None   Collection Time: 09/01/19  3:03 AM   Specimen: Nasal Mucosa; Nasal Swab   Result Value Ref Range Status   MRSA, PCR NEGATIVE NEGATIVE Final   Staphylococcus aureus NEGATIVE NEGATIVE Final    Comment: (NOTE) The Xpert SA Assay (FDA approved for NASAL specimens in patients 12 years of age and older), is one component of a comprehensive surveillance program. It is not intended to diagnose infection nor to guide or monitor treatment. Performed at [facility], [place] [address] [place], [place] [address]          Radiology Studies: CT CHEST W CONTRAST  Result Date: 09/01/2019 CLINICAL DATA:  Abnormal chest radiograph, pleural effusion, empyema EXAM: CT CHEST WITH CONTRAST TECHNIQUE: Multidetector CT imaging of the chest was performed during intravenous contrast administration. CONTRAST:  60mL OMNIPAQUE IOHEXOL 300 MG/ML  SOLN COMPARISON:  CT 08/30/2019 FINDINGS: Cardiovascular: Upper limits normal cardiac size. Three-vessel coronary artery disease is noted. Small pericardial effusion, stable from prior. Atherosclerotic plaque within the normal caliber aorta. Three vessel branching of the aortic arch with minimal plaque in the proximal subclavian arteries. Remaining great vessels are unremarkable. Central pulmonary arteries are normal caliber. Contrast bolus is inadequate for assessment of pulmonary emboli. Mediastinum/Nodes: No mediastinal fluid or  gas. Normal thyroid gland and thoracic inlet. There are extensive secretions throughout the airways including abrupt narrowing of the central airways to the right middle and lower lobe (3/80, 3/72). High attenuation contrast material within the thoracic esophagus, correlate for reflux. Scattered low-attenuation subcentimeter mediastinal and hilar nodes are likely reactive. No worrisome mediastinal, hilar or axillary adenopathy. Lungs/Pleura: There is a right pigtail pleural drain entering at the sixth interspace, mid axillary line and positioned posteriorly in the mid to lower lung within a large, thick-walled air and  fluid containing collection compatible with partially drained empyema seen previously. Overall size of this collection has significantly decreased from prior exam but does remain moderate in overall extent. There is extensive visceral and parietal pleural thickening is noted throughout the right hemithorax. Concave margins of the an expanded lung may suggest some in trapped lung with failed re-expansion secondary to the active pleural inflammation. There is extensive residual passive atelectasis throughout the right hemithorax including near complete atelectatic collapse of the right lower lobe. Associated diffuse airways thickening is again seen. There scattered areas of peripheral ground-glass in the right middle and upper lobe. A small volume left pleural effusion without complicating pleural thickening is noted. Several fluid-filled lower lobe airways are present as well in the left lower lobe with adjacent peribronchovascular opacity likely reflecting developing consolidation/inflammation. Upper Abdomen: Mild periportal edema within the liver as well as small volume perihepatic ascites. Heterogeneous attenuation of the spleen may be related to contrast timing. Musculoskeletal: Multilevel degenerative changes are present in the imaged portions of the spine. No acute osseous abnormality or suspicious osseous lesion. Moderate circumferential body wall edema. No suspicious chest wall lesions. IMPRESSION: 1. Right pigtail pleural drain is present within a large, thick-walled air and fluid containing collection compatible with partially drained empyema seen previously. Overall size of this collection has significantly decreased from prior exam but does remain moderate in overall extent with increasing air component. 2. Extensive visceral and parietal pleural thickening throughout the right hemithorax with concave margins of the incompletely reexpanded lung may suggest some lung entrapment with failed re-expansion  secondary to the active pleural inflammation. 3. Extensive passive atelectasis throughout the right hemithorax, underlying consolidation is not excluded. 4. Small volume left pleural effusion without complicating pleural thickening. 5. Extensive fluid secretions throughout the airways predominantly in the right mid to lower lung with narrowing of several middle and lower lobe central airways and with multiple fluid filled airways in the left lung base as well as developing peribronchial consolidation concerning for sequela of aspiration. 6. Persistent patchy ground-glass opacities in right middle and upper lobes also likely reflect some postobstructive pneumonitis or infection. 7. Features of anasarca with periportal edema, circumferential body wall edema and small volume ascites. 8. Aortic Atherosclerosis (ICD10-I70.0). 9. Coronary artery calcifications are present. Please note that the presence of coronary artery calcium documents the presence of coronary artery disease, the severity of this disease and any potential stenosis cannot be assessed on this non-gated CT examination. Assessment for potential risk factor modification, dietary therapy or pharmacologic therapy may be warranted. Electronically Signed   By: Lovena Le M.D.   On: 09/01/2019 02:26        Scheduled Meds: . [START ON 09/02/2019] DULoxetine  60 mg Oral Daily  . [START ON 09/02/2019] ferrous sulfate  300 mg Per Tube Q breakfast  . insulin aspart  0-9 Units Subcutaneous Q4H  . traZODone  25 mg Oral QHS   Continuous Infusions: . sodium chloride 100 mL/hr at  09/01/19 0324  . meropenem (MERREM) IV 1 g (09/01/19 0327)  . vancomycin 750 mg (09/01/19 0423)     LOS: 1 day   Time spent= 35 mins    Rayola Everhart Arsenio Loader, MD [facility]  If 7PM-7AM, please contact night-coverage  09/01/2019, 7:47 AM

## 2019-09-01 NOTE — Plan of Care (Signed)
NEW admit MD just informed pt he is a diabetic A1C 8

## 2019-09-01 NOTE — Anesthesia Preprocedure Evaluation (Addendum)
Anesthesia Evaluation  Patient identified by MRN, date of birth, ID band Patient awake    Reviewed: Allergy & Precautions, H&P , NPO status , Patient's Chart, lab work & pertinent test results  History of Anesthesia Complications Negative for: history of anesthetic complications  Airway Mallampati: IV  TM Distance: >3 FB Neck ROM: Limited  Mouth opening: Limited Mouth Opening  Dental no notable dental hx. (+) Edentulous Upper, Dental Advisory Given   Pulmonary neg pulmonary ROS, former smoker,   Empyema  09/01/19 CXR - Right chest tube is identified and appears unchanged in position from previous exam. No change volume of loculated empyema containing air overlying the right lung. Diffuse hazy lung opacities within the right lung appears similar to previous exam. Left lung clear. IMPRESSION: 1. No change in volume of loculated empyema containing air overlying the right lung. 2. Stable position of right chest tube    + rhonchi        Cardiovascular negative cardio ROS   Rhythm:Regular Rate:Normal     Neuro/Psych negative neurological ROS  negative psych ROS   GI/Hepatic negative GI ROS, (+) Hepatitis -, C Malnutrition    Endo/Other  diabetes, Type 2 Hyponatremia Hypocalcemia Hypokalemia Hypochloremia   Renal/GU negative Renal ROS  negative genitourinary   Musculoskeletal negative musculoskeletal ROS (+)   Abdominal   Peds  Hematology negative hematology ROS (+) anemia ,   Anesthesia Other Findings Tongue and neck cancer s/p resection Fentanyl patch for pain control   Reproductive/Obstetrics negative OB ROS                           Anesthesia Physical Anesthesia Plan  ASA: IV  Anesthesia Plan: General   Post-op Pain Management:    Induction: Intravenous  PONV Risk Score and Plan: 3 and Treatment may vary due to age or medical condition, Ondansetron and  Dexamethasone  Airway Management Planned: Oral ETT and Video Laryngoscope Planned  Additional Equipment: Arterial line  Intra-op Plan:   Post-operative Plan: Possible Post-op intubation/ventilation  Informed Consent: I have reviewed the patients History and Physical, chart, labs and discussed the procedure including the risks, benefits and alternatives for the proposed anesthesia with the patient or authorized representative who has indicated his/her understanding and acceptance.     Dental advisory given  Plan Discussed with: CRNA and Anesthesiologist  Anesthesia Plan Comments:       Anesthesia Quick Evaluation

## 2019-09-01 NOTE — H&P (Signed)
History and Physical    Brandon Miles [id number] DOB: 01-29-1951 DOA: 08/31/2019  PCP: Laurey Morale, MD  Patient coming from: Incoming transfer from [facility]   Chief Complaint: Weakness  HPI:    69 year old male with past medical history of hepatitis C, throat cancer status post radiation therapy and surgical therapy status post PEG tube placement who presented to [facility] and was later transferred to [facility] due to findings of a right-sided empyema.  Patient presented to his outpatient provider with a 3-week history of progressing generalized weakness.  Generalized weakness was severe in intensity worse with exertion and improved with rest.  Generalized weakness associated with progressively worsening weakness and dry nonproductive cough.  Patient also complains of occasional shortness of breath.  Patient also complaining of associated thoracic back discomfort, worse with deep inspiration and improved with rest.  Discomfort is sharp in quality, moderate intensity and has also been progressively worsening over the past several weeks.  Patient states that the symptoms worsened in the past several weeks she has rapidly lost weight.  Patient has been administering his tube feeds as instructed.  Patient eventually underwent outpatient right-sided thoracentesis by outpatient interventional radiology revealing 300 cc of purulent fluid.  Due to this finding patient was sent to [facility] emergency department for evaluation.  Upon evaluation at [facility], CT imaging of the chest revealed a thick-walled right pleural fluid collection with compressive atelectasis and right middle and upper lobe groundglass opacities concerning for adjacent pneumonitis.  Patient was initiated on intravenous vancomycin and Zosyn.  Patient underwent CT-guided placement of chest tube by interventional radiology at [facility].  Patient was noted to have a nonfunctioning PEG  tube while being evaluated at [facility] which was then exchanged with a Foley catheter.  Patient is being transferred to [facility] for CT surgery consultation and decortication of empyema.   Review of Systems: A 10-system review of systems has been performed and all systems are negative with the exception of what is listed in the HPI.   Past Medical History:  Diagnosis Date  . Cancer    tongue and neck  . Hepatitis C     Past Surgical History:  Procedure Laterality Date  . OTHER SURGICAL HISTORY  06/01/2012   part of tongue removed and reconstructed     reports that he has quit smoking. He has never used smokeless tobacco. He reports that he does not drink alcohol. No history on file for drug.  Allergies  Allergen Reactions  . Codeine     REACTION: vomitting    No family history on file.   Prior to Admission medications   Medication Sig Start Date End Date Taking? Authorizing Provider  Alum & Mag Hydroxide-Simeth (GI COCKTAIL) SUSP suspension Swish and spit 5 mLs 4 (four) times daily as needed (for pain). Shake well.    [provider]  Alum & Mag Hydroxide-Simeth (MAGIC MOUTHWASH) SOLN Swish and spit 10 mLs 4 (four) times daily as needed (for thrush).    [provider]  fentaNYL (DURAGESIC - DOSED MCG/HR) 25 MCG/HR Place 1 patch onto the skin every 3 (three) days.    [provider]  ondansetron (ZOFRAN) 4 MG tablet Take 1 tablet (4 mg total) by mouth every 6 (six) hours. 08/14/12   Veryl Speak, MD  oxyCODONE (ROXICODONE) 5 MG/5ML solution Take 5-10 mg by mouth every 4 (four) hours as needed for pain.    [provider]  prochlorperazine (  COMPAZINE) 10 MG tablet Give 10 mg by tube every 6 (six) hours as needed (for nausea/vomiting).    [provider]    Physical Exam: Vitals:   08/31/19 2320 09/01/19 0145  BP:  135/88  Pulse:  77  Resp:  17  Temp:  98.4 F (36.9 C)  TempSrc:  Oral  SpO2:  97%  Weight:  58.9 kg   Height: 5\' 9"  (1.753 m)     Constitutional: Acute alert and oriented x3, no associated distress.  Patient is cachectic. Skin: no rashes, no lesions, poor skin turgor noted. Eyes: Pupils are equally reactive to light.  No evidence of scleral icterus or conjunctival pallor.  ENMT: Moist mucous membranes noted.  Posterior pharynx clear of any exudate or lesions.   Neck: normal, supple, no masses, no thyromegaly.  No evidence of jugular venous distension.   Respiratory: Markedly diminished breath sounds throughout the right lung fields with associated rhonchi.  No evidence of wheezing.  Normal respiratory effort. No accessory muscle use.  Cardiovascular: Regular rate and rhythm, no murmurs / rubs / gallops. No extremity edema. 2+ pedal pulses. No carotid bruits.  Chest:   Right posterior chest tube in place draining purulent drainage and connected to suction.   Back:   Nontender without crepitus or deformity. Abdomen: PEG tube in place abdomen is soft and nontender.  No evidence of intra-abdominal masses.  Positive bowel sounds noted in all quadrants.   Musculoskeletal: No joint deformity upper and lower extremities. Good ROM, no contractures. Normal muscle tone.  Neurologic: CN 2-12 grossly intact. Sensation intact, strength noted to be 5 out of 5 in all 4 extremities.  Patient is following all commands.  Patient is responsive to verbal stimuli.   Psychiatric: Patient presents as a normal mood with appropriate affect.  Patient seems to possess insight as to theircurrent situation.     Labs on Admission: I have personally reviewed following labs and imaging studies -   CBC: Recent Labs  Lab 09/01/19 0017  WBC 10.9*  NEUTROABS 9.6*  HGB 11.9*  HCT 36.8*  MCV 88.2  PLT 123456*   Basic Metabolic Panel: Recent Labs  Lab 09/01/19 0017  NA 128*  K 3.5  CL 84*  CO2 37*  GLUCOSE 148*  BUN <5*  CREATININE 0.59*  CALCIUM 7.9*  MG 1.8   GFR: Estimated Creatinine Clearance: 73.6  mL/min (A) (by C-G formula based on SCr of 0.59 mg/dL (L)). Liver Function Tests: Recent Labs  Lab 09/01/19 0017  AST 35  ALT 24  ALKPHOS 75  BILITOT 0.6  PROT 6.1*  ALBUMIN 1.5*   No results for input(s): LIPASE, AMYLASE in the last 168 hours. No results for input(s): AMMONIA in the last 168 hours. Coagulation Profile: Recent Labs  Lab 09/01/19 0017  INR 1.1   Cardiac Enzymes: No results for input(s): CKTOTAL, CKMB, CKMBINDEX, TROPONINI in the last 168 hours. BNP (last 3 results) No results for input(s): PROBNP in the last 8760 hours. HbA1C: Recent Labs    09/01/19 0017  HGBA1C 8.0*   CBG: No results for input(s): GLUCAP in the last 168 hours. Lipid Profile: No results for input(s): CHOL, HDL, LDLCALC, TRIG, CHOLHDL, LDLDIRECT in the last 72 hours. Thyroid Function Tests: Recent Labs    09/01/19 0017  TSH 9.442*   Anemia Panel: No results for input(s): VITAMINB12, FOLATE, FERRITIN, TIBC, IRON, RETICCTPCT in the last 72 hours. Urine analysis:    Component Value Date/Time   COLORURINE YELLOW 09/01/2019 0032  APPEARANCEUR CLEAR 09/01/2019 0032   LABSPEC 1.024 09/01/2019 0032   PHURINE 7.0 09/01/2019 0032   GLUCOSEU NEGATIVE 09/01/2019 0032   HGBUR NEGATIVE 09/01/2019 0032   BILIRUBINUR NEGATIVE 09/01/2019 0032   KETONESUR NEGATIVE 09/01/2019 0032   PROTEINUR NEGATIVE 09/01/2019 0032   NITRITE NEGATIVE 09/01/2019 0032   LEUKOCYTESUR NEGATIVE 09/01/2019 0032    Radiological Exams on Admission - Personally Reviewed: CT CHEST W CONTRAST  Result Date: 09/01/2019 CLINICAL DATA:  Abnormal chest radiograph, pleural effusion, empyema EXAM: CT CHEST WITH CONTRAST TECHNIQUE: Multidetector CT imaging of the chest was performed during intravenous contrast administration. CONTRAST:  60mL OMNIPAQUE IOHEXOL 300 MG/ML  SOLN COMPARISON:  CT 08/30/2019 FINDINGS: Cardiovascular: Upper limits normal cardiac size. Three-vessel coronary artery disease is noted. Small pericardial  effusion, stable from prior. Atherosclerotic plaque within the normal caliber aorta. Three vessel branching of the aortic arch with minimal plaque in the proximal subclavian arteries. Remaining great vessels are unremarkable. Central pulmonary arteries are normal caliber. Contrast bolus is inadequate for assessment of pulmonary emboli. Mediastinum/Nodes: No mediastinal fluid or gas. Normal thyroid gland and thoracic inlet. There are extensive secretions throughout the airways including abrupt narrowing of the central airways to the right middle and lower lobe (3/80, 3/72). High attenuation contrast material within the thoracic esophagus, correlate for reflux. Scattered low-attenuation subcentimeter mediastinal and hilar nodes are likely reactive. No worrisome mediastinal, hilar or axillary adenopathy. Lungs/Pleura: There is a right pigtail pleural drain entering at the sixth interspace, mid axillary line and positioned posteriorly in the mid to lower lung within a large, thick-walled air and fluid containing collection compatible with partially drained empyema seen previously. Overall size of this collection has significantly decreased from prior exam but does remain moderate in overall extent. There is extensive visceral and parietal pleural thickening is noted throughout the right hemithorax. Concave margins of the an expanded lung may suggest some in trapped lung with failed re-expansion secondary to the active pleural inflammation. There is extensive residual passive atelectasis throughout the right hemithorax including near complete atelectatic collapse of the right lower lobe. Associated diffuse airways thickening is again seen. There scattered areas of peripheral ground-glass in the right middle and upper lobe. A small volume left pleural effusion without complicating pleural thickening is noted. Several fluid-filled lower lobe airways are present as well in the left lower lobe with adjacent  peribronchovascular opacity likely reflecting developing consolidation/inflammation. Upper Abdomen: Mild periportal edema within the liver as well as small volume perihepatic ascites. Heterogeneous attenuation of the spleen may be related to contrast timing. Musculoskeletal: Multilevel degenerative changes are present in the imaged portions of the spine. No acute osseous abnormality or suspicious osseous lesion. Moderate circumferential body wall edema. No suspicious chest wall lesions. IMPRESSION: 1. Right pigtail pleural drain is present within a large, thick-walled air and fluid containing collection compatible with partially drained empyema seen previously. Overall size of this collection has significantly decreased from prior exam but does remain moderate in overall extent with increasing air component. 2. Extensive visceral and parietal pleural thickening throughout the right hemithorax with concave margins of the incompletely reexpanded lung may suggest some lung entrapment with failed re-expansion secondary to the active pleural inflammation. 3. Extensive passive atelectasis throughout the right hemithorax, underlying consolidation is not excluded. 4. Small volume left pleural effusion without complicating pleural thickening. 5. Extensive fluid secretions throughout the airways predominantly in the right mid to lower lung with narrowing of several middle and lower lobe central airways and with multiple fluid filled  airways in the left lung base as well as developing peribronchial consolidation concerning for sequela of aspiration. 6. Persistent patchy ground-glass opacities in right middle and upper lobes also likely reflect some postobstructive pneumonitis or infection. 7. Features of anasarca with periportal edema, circumferential body wall edema and small volume ascites. 8. Aortic Atherosclerosis (ICD10-I70.0). 9. Coronary artery calcifications are present. Please note that the presence of coronary artery  calcium documents the presence of coronary artery disease, the severity of this disease and any potential stenosis cannot be assessed on this non-gated CT examination. Assessment for potential risk factor modification, dietary therapy or pharmacologic therapy may be warranted. Electronically Signed   By: Lovena Le M.D.   On: 09/01/2019 02:26    Telemetry: Personally reviewed.  Sinus tachycardia at 100 bpm.  Assessment/Plan Principal Problem:   Empyema of right pleural space [facility])   Patient presenting with several week history of progressively worsening weakness, dyspnea found to have complicated right-sided pleural effusion and empyema with adjacent pneumonitis of the right middle and upper lobes  Continuing regimen intravenous vancomycin from [facility] intravenous Zosyn to alternative broad-spectrum agent with anaerobic coverage such as meropenem to avoid nephrotoxicity from combination of Vancomycin/Zosyn.  Hydrate patient with intravenous isotonic fluids  Dr. Prescott Gum with surgery has already been notified of the patient's arrival in the unit and stated that he will come evaluate the patient, expect the patient will go for decortication procedure sometime in the morning of 5/27.  Labs reviewed from [facility], culture of right pleural fluid revealed no growth at time results were printed.  Providing supplemental oxygen as necessary for bouts of hypoxia  Active Problems:   Uncontrolled type 2 diabetes mellitus with hyperglycemia, without long-term current use of insulin (HCC)   New diagnosis, hemoglobin A1c of 8.0  Accu-Cheks every 4 hours with/well insulin since patient is on tube feed regimen  Patient will need diabetic education and progression of glucometer strips and lancets at time of discharge.    Hyponatremia   Patient exhibiting moderate hyponatremia at [facility] which is persisting upon arrival to the medical unit [facility].  Possibly due to volume depletion, hydrating patient gently with isotonic normal saline at 100 cc an hour  Performing serial chemistries to monitor serum levels and ensure slow measured correction.  Obtaining urine sodium, urine osmolality, serum osmolality, cortisol, TSH to better evaluate etiology of hyponatremia    Severe protein-calorie malnutrition ([place])  Patient reports substantial weight loss in the past several weeks  Patient exhibits evidence of muscle wasting on examination with a BMI of 19.18.  Patient is likely suffering from severe protein calorie malnutrition with albumin of 1.5.  Nutrition consult placed for tube feed recommendations now with new diagnosis of diabetes mellitus.   Code Status:  Full code Family Communication: Wife notified of arrival via phone Status is: Inpatient  Remains inpatient appropriate because:Persistent severe electrolyte disturbances, Ongoing active pain requiring inpatient pain management, IV treatments appropriate due to intensity of illness or inability to take PO and Inpatient level of care appropriate due to severity of illness   Dispo: The patient is from: Home              Anticipated d/c is to: Home              Anticipated d/c date is: > 3 days              Patient currently is not medically stable to d/c.  Vernelle Emerald MD [facility] Pager [phone]  If 7PM-7AM, please contact night-coverage [email or web address] Use universal [place] password for that web site. If you do not have the password, please call the hospital operator.  09/01/2019, 3:12 AM

## 2019-09-02 ENCOUNTER — Inpatient Hospital Stay (HOSPITAL_COMMUNITY): Payer: No Typology Code available for payment source

## 2019-09-02 LAB — POCT I-STAT 7, (LYTES, BLD GAS, ICA,H+H)
Acid-Base Excess: 12 mmol/L — ABNORMAL HIGH (ref 0.0–2.0)
Acid-Base Excess: 9 mmol/L — ABNORMAL HIGH (ref 0.0–2.0)
Acid-Base Excess: 6 mmol/L — ABNORMAL HIGH (ref 0.0–2.0)
Bicarbonate: 37.9 mmol/L — ABNORMAL HIGH (ref 20.0–28.0)
Bicarbonate: 33.4 mmol/L — ABNORMAL HIGH (ref 20.0–28.0)
Bicarbonate: 31.1 mmol/L — ABNORMAL HIGH (ref 20.0–28.0)
Calcium, Ion: 1.09 mmol/L — ABNORMAL LOW (ref 1.15–1.40)
Calcium, Ion: 1.16 mmol/L (ref 1.15–1.40)
Calcium, Ion: 1.17 mmol/L (ref 1.15–1.40)
HCT: 28 % — ABNORMAL LOW (ref 39.0–52.0)
HCT: 39 % (ref 39.0–52.0)
HCT: 33 % — ABNORMAL LOW (ref 39.0–52.0)
Hemoglobin: 9.5 g/dL — ABNORMAL LOW (ref 13.0–17.0)
Hemoglobin: 13.3 g/dL (ref 13.0–17.0)
Hemoglobin: 11.2 g/dL — ABNORMAL LOW (ref 13.0–17.0)
O2 Saturation: 100 %
O2 Saturation: 100 %
O2 Saturation: 98 %
Patient temperature: 97.8
Patient temperature: 97.8
Potassium: 3.1 mmol/L — ABNORMAL LOW (ref 3.5–5.1)
Potassium: 4.1 mmol/L (ref 3.5–5.1)
Potassium: 3.9 mmol/L (ref 3.5–5.1)
Sodium: 128 mmol/L — ABNORMAL LOW (ref 135–145)
Sodium: 133 mmol/L — ABNORMAL LOW (ref 135–145)
Sodium: 133 mmol/L — ABNORMAL LOW (ref 135–145)
TCO2: 40 mmol/L — ABNORMAL HIGH (ref 22–32)
TCO2: 35 mmol/L — ABNORMAL HIGH (ref 22–32)
TCO2: 32 mmol/L (ref 22–32)
pCO2 arterial: 55.6 mmHg — ABNORMAL HIGH (ref 32.0–48.0)
pCO2 arterial: 42 mmHg (ref 32.0–48.0)
pCO2 arterial: 44.9 mmHg (ref 32.0–48.0)
pH, Arterial: 7.441 (ref 7.350–7.450)
pH, Arterial: 7.507 — ABNORMAL HIGH (ref 7.350–7.450)
pH, Arterial: 7.446 (ref 7.350–7.450)
pO2, Arterial: 354 mmHg — ABNORMAL HIGH (ref 83.0–108.0)
pO2, Arterial: 169 mmHg — ABNORMAL HIGH (ref 83.0–108.0)
pO2, Arterial: 101 mmHg (ref 83.0–108.0)

## 2019-09-02 LAB — GLUCOSE, CAPILLARY
Glucose-Capillary: 110 mg/dL — ABNORMAL HIGH (ref 70–99)
Glucose-Capillary: 152 mg/dL — ABNORMAL HIGH (ref 70–99)
Glucose-Capillary: 163 mg/dL — ABNORMAL HIGH (ref 70–99)
Glucose-Capillary: 196 mg/dL — ABNORMAL HIGH (ref 70–99)

## 2019-09-02 LAB — COOXEMETRY PANEL
Carboxyhemoglobin: 1.8 % — ABNORMAL HIGH (ref 0.5–1.5)
Methemoglobin: 1 % (ref 0.0–1.5)
O2 Saturation: 78.6 %
Total hemoglobin: 12.4 g/dL (ref 12.0–16.0)

## 2019-09-02 LAB — CBC
HCT: 37.9 % — ABNORMAL LOW (ref 39.0–52.0)
Hemoglobin: 12.5 g/dL — ABNORMAL LOW (ref 13.0–17.0)
MCH: 28.5 pg (ref 26.0–34.0)
MCHC: 33 g/dL (ref 30.0–36.0)
MCV: 86.3 fL (ref 80.0–100.0)
Platelets: 583 10*3/uL — ABNORMAL HIGH (ref 150–400)
RBC: 4.39 MIL/uL (ref 4.22–5.81)
RDW: 13 % (ref 11.5–15.5)
WBC: 16.1 10*3/uL — ABNORMAL HIGH (ref 4.0–10.5)
nRBC: 0 % (ref 0.0–0.2)

## 2019-09-02 LAB — BASIC METABOLIC PANEL
Anion gap: 6 (ref 5–15)
BUN: 8 mg/dL (ref 8–23)
CO2: 30 mmol/L (ref 22–32)
Calcium: 7.9 mg/dL — ABNORMAL LOW (ref 8.9–10.3)
Chloride: 94 mmol/L — ABNORMAL LOW (ref 98–111)
Creatinine, Ser: 0.48 mg/dL — ABNORMAL LOW (ref 0.61–1.24)
GFR calc Af Amer: >60 mL/min (ref >60)
GFR calc non Af Amer: >60 mL/min (ref >60)
Glucose, Bld: 186 mg/dL — ABNORMAL HIGH (ref 70–99)
Potassium: 4.1 mmol/L (ref 3.5–5.1)
Sodium: 130 mmol/L — ABNORMAL LOW (ref 135–145)

## 2019-09-02 LAB — PHOSPHORUS: Phosphorus: 3.1 mg/dL (ref 2.5–4.6)

## 2019-09-02 LAB — MAGNESIUM: Magnesium: 1.9 mg/dL (ref 1.7–2.4)

## 2019-09-02 MED ORDER — ACETAMINOPHEN 500 MG PO TABS
1000.0000 mg | ORAL_TABLET | Freq: Four times a day (QID) | ORAL | Status: AC
  Administered 2019-09-02 – 2019-09-04 (×3): 1000 mg via ORAL
  Filled 2019-09-02 (×3): qty 2

## 2019-09-02 MED ORDER — ADULT MULTIVITAMIN W/MINERALS CH
1.0000 | ORAL_TABLET | Freq: Every day | ORAL | Status: DC
  Administered 2019-09-03 – 2019-09-09 (×7): 1
  Filled 2019-09-02 (×7): qty 1

## 2019-09-02 MED ORDER — DIPHENHYDRAMINE HCL 50 MG/ML IJ SOLN: 12.5000 mg | Freq: Four times a day (QID) | INTRAMUSCULAR | Status: DC | PRN

## 2019-09-02 MED ORDER — TRAZODONE HCL 50 MG PO TABS
25.0000 mg | ORAL_TABLET | Freq: Every day | ORAL | Status: DC
  Administered 2019-09-02 – 2019-09-08 (×7): 25 mg
  Filled 2019-09-02 (×7): qty 1

## 2019-09-02 MED ORDER — FENTANYL 50 MCG/ML IV PCA SOLN
INTRAVENOUS | Status: DC
  Administered 2019-09-02: 8 ug via INTRAVENOUS
  Administered 2019-09-03: 120 ug/h via INTRAVENOUS
  Administered 2019-09-03: 140 ug via INTRAVENOUS
  Filled 2019-09-02: qty 20

## 2019-09-02 MED ORDER — SODIUM CHLORIDE 0.9% FLUSH: 9.0000 mL | INTRAVENOUS | Status: DC | PRN

## 2019-09-02 MED ORDER — CHLORHEXIDINE GLUCONATE 0.12 % MT SOLN
15.0000 mL | Freq: Two times a day (BID) | OROMUCOSAL | Status: DC
  Administered 2019-09-02 – 2019-09-09 (×11): 15 mL via OROMUCOSAL
  Filled 2019-09-02 (×12): qty 15

## 2019-09-02 MED ORDER — CHLORHEXIDINE GLUCONATE CLOTH 2 % EX PADS
6.0000 | MEDICATED_PAD | Freq: Every day | CUTANEOUS | Status: DC
  Administered 2019-09-02 – 2019-09-09 (×7): 6 via TOPICAL

## 2019-09-02 MED ORDER — DIPHENHYDRAMINE HCL 12.5 MG/5ML PO ELIX: 12.5000 mg | ORAL_SOLUTION | Freq: Four times a day (QID) | ORAL | Status: DC | PRN

## 2019-09-02 MED ORDER — GUAIFENESIN-DM 100-10 MG/5ML PO SYRP
10.0000 mL | ORAL_SOLUTION | Freq: Four times a day (QID) | ORAL | Status: DC
  Administered 2019-09-02 – 2019-09-09 (×27): 10 mL
  Filled 2019-09-02 (×27): qty 10

## 2019-09-02 MED ORDER — SENNOSIDES-DOCUSATE SODIUM 8.6-50 MG PO TABS
1.0000 | ORAL_TABLET | Freq: Every day | ORAL | Status: DC
  Administered 2019-09-02 – 2019-09-08 (×5): 1
  Filled 2019-09-02 (×6): qty 1

## 2019-09-02 MED ORDER — NALOXONE HCL 0.4 MG/ML IJ SOLN: 0.4000 mg | INTRAMUSCULAR | Status: DC | PRN

## 2019-09-02 MED ORDER — ADULT MULTIVITAMIN LIQUID CH: 15.0000 mL | Freq: Every day | ORAL | Status: DC

## 2019-09-02 MED ORDER — ACETAMINOPHEN 160 MG/5ML PO SOLN
1000.0000 mg | Freq: Four times a day (QID) | ORAL | Status: AC
  Administered 2019-09-02 – 2019-09-06 (×13): 1000 mg
  Filled 2019-09-02 (×14): qty 40.6

## 2019-09-02 MED ORDER — ORAL CARE MOUTH RINSE
15.0000 mL | Freq: Two times a day (BID) | OROMUCOSAL | Status: DC
  Administered 2019-09-02 – 2019-09-05 (×3): 15 mL via OROMUCOSAL

## 2019-09-02 NOTE — Progress Notes (Addendum)
TCTS DAILY ICU PROGRESS NOTE                   [address]            [place] [address]          [phone]   1 Day Post-Op Procedure(s) (LRB): VIDEO ASSISTED THORACOSCOPY (VATS)/EMPYEMA  AND DECORTICATION (Right) Insertion Central Line Adult (Left)  Total Length of Stay:  LOS: 2 days   Subjective: Patient awake and alert this am, is intubated. He is able to follow commands  Objective: Vital signs in last 24 hours: Temp:  [97.5 F (36.4 C)-97.8 F (36.6 C)] 97.8 F (36.6 C) (05/28 0000) Pulse Rate:  [59-104] 63 (05/28 0800) Cardiac Rhythm: Normal sinus rhythm (05/28 0400) Resp:  [0-25] 14 (05/28 0800) BP: (91-139)/(60-84) 129/72 (05/28 0800) SpO2:  [97 %-100 %] 100 % (05/28 0800) Arterial Line BP: (101-150)/(42-69) 132/59 (05/28 0800) FiO2 (%):  [40 %-50 %] 40 % (05/28 0758) Weight:  [58.9 kg-64.2 kg] 64.2 kg (05/28 0600)  Filed Weights   08/31/19 2320 09/01/19 1447 09/02/19 0600  Weight: 58.9 kg 58.9 kg 64.2 kg    Weight change: 0 kg   Hemodynamic parameters for last 24 hours:    Intake/Output from previous day: 05/27 0701 - 05/28 0700 In: 4892.3 [I.V.:3723.8; Blood:315; IV Piggyback:853.5] Out: 5681 [Urine:2210; Drains:125; Blood:100; Chest Tube:320]  Intake/Output this shift: Total I/O In: -  Out: 137 [Urine:120; Drains:7; Chest Tube:10]  Current Meds: Scheduled Meds: . acetaminophen  1,000 mg Oral Q6H   Or  . acetaminophen (TYLENOL) oral liquid 160 mg/5 mL  1,000 mg Oral Q6H  . bisacodyl  10 mg Oral Daily  . chlorhexidine gluconate (MEDLINE KIT)  15 mL Mouth Rinse BID  . Chlorhexidine Gluconate Cloth  6 each Topical Daily  . DULoxetine  60 mg Oral Daily  . ferrous sulfate  300 mg Per Tube Q breakfast  . guaiFENesin-dextromethorphan  10 mL Oral Q6H  . insulin aspart  0-24 Units Subcutaneous Q6H  . mouth rinse  15 mL Mouth Rinse 10 times per day  . metoCLOPramide (REGLAN) injection  5 mg Intravenous Q6H  . multivitamin  15 mL  Oral Daily  . senna-docusate  1 tablet Oral QHS  . sodium chloride flush  10-40 mL Intracatheter Q12H  . traZODone  25 mg Oral QHS   Continuous Infusions: . sodium chloride    . 0.9 % NaCl with KCl 20 mEq / L 100 mL/hr at 09/02/19 0608  . dexmedetomidine (PRECEDEX) IV infusion 0.4 mcg/kg/hr (09/02/19 2751)  . meropenem (MERREM) IV 1 g (09/02/19 0622)  . phenylephrine (NEO-SYNEPHRINE) Adult infusion 30 mcg/min (09/02/19 0600)  . vancomycin 150 mL/hr at 09/02/19 0600   PRN Meds:.Place/Maintain arterial line **AND** sodium chloride, fentaNYL (SUBLIMAZE) injection, fentaNYL (SUBLIMAZE) injection, ipratropium-albuterol, midazolam, midazolam, ondansetron (ZOFRAN) IV, oxyCODONE, sodium chloride flush, traMADol  General appearance: alert, cooperative and no distress Neurologic: intact Heart: RRR Lungs: Slightly diminished right base but clear on the left Abdomen: Soft, non tender, Gastrostomy tube in place, occasional bowel sounds Extremities: SCDs in place Wound: Clean and dry  Lab Results: CBC: Recent Labs    09/01/19 0017 09/01/19 1607 09/02/19 0306 09/02/19 0532  WBC 10.9*  --  16.1*  --   HGB 11.9*   < > 12.5* 13.3  HCT 36.8*   < > 37.9* 39.0  PLT 580*  --  583*  --    < > = values in this interval  not displayed.   BMET:  Recent Labs    09/01/19 0635 09/01/19 1607 09/02/19 0306 09/02/19 0532  NA 127*   < > 130* 133*  K 3.3*   < > 4.1 4.1  CL 84*  --  94*  --   CO2 34*  --  30  --   GLUCOSE 118*  --  186*  --   BUN <5*  --  8  --   CREATININE 0.55*  --  0.48*  --   CALCIUM 7.6*  --  7.9*  --    < > = values in this interval not displayed.    CMET: Lab Results  Component Value Date   WBC 16.1 (H) 09/02/2019   HGB 13.3 09/02/2019   HCT 39.0 09/02/2019   PLT 583 (H) 09/02/2019   GLUCOSE 186 (H) 09/02/2019   ALT 24 09/01/2019   AST 35 09/01/2019   NA 133 (L) 09/02/2019   K 4.1 09/02/2019   CL 94 (L) 09/02/2019   CREATININE 0.48 (L) 09/02/2019   BUN 8  09/02/2019   CO2 30 09/02/2019   TSH 9.442 (H) 09/01/2019   INR 1.1 09/01/2019   HGBA1C 8.0 (H) 09/01/2019      PT/INR:  Recent Labs    09/01/19 0017  LABPROT 13.4  INR 1.1   Radiology: DG Chest Portable 1 View  Result Date: 09/01/2019 CLINICAL DATA:  Post video-assisted thoracoscopy, empyema. EXAM: PORTABLE CHEST 1 VIEW COMPARISON:  Preoperative radiograph and CT earlier today. FINDINGS: Endotracheal tube tip is 2.9 cm from the carina. Left subclavian central venous catheter tip the region of the upper SVC. Right pigtail catheter is been removed, there are 2 right chest tubes in place. Residual pleural thickening/loculated effusion laterally. Right hydropneumothorax is diminished with decreased air component and residual pleural thickening/partially loculated effusion laterally. Suspect small residual extrapleural air at the right lung base. Scattered atelectasis throughout the right lung. Heart is normal in size with normal mediastinal contours. No left pneumothorax. Small left pleural effusion on CT is not well demonstrated by radiograph. IMPRESSION: 1. Endotracheal tube tip 2.9 cm from the carina. 2. Tip of the left subclavian central line in the upper SVC. No left pneumothorax. 3. Post interval right VATS with removal of pigtail catheter, placement of 2 right chest tubes, and diminished empyema. There is residual pleural partially loculated fluid laterally and suspected small air-fluid component at the base. Electronically Signed   By: Keith Rake M.D.   On: 09/01/2019 19:52     Assessment/Plan: S/P Procedure(s) (LRB): VIDEO ASSISTED THORACOSCOPY (VATS)/EMPYEMA  AND DECORTICATION (Right) Insertion Central Line Adult (Left) 1. CV-SR with HR in the 60's. Co ox this am 78.6. Will remove a line once off Neo Synephrine drip 2. Pulmonary-remains intubated (FiO2% 40). ABG results: Ph=7.507, PCO2=42,PO2=169, HCO3=33.4. Chest tubes are to suction and there is a + 3 intermittent air leak.  Chest tubes with 320 cc since surgery. CXR this am appears stable (small right pleural effusion). Chest tubes to remain today. Rapid wean to extubate this am. 3. ID-on Vancomycin and Meropenem for empyema. Fluid grams stain showed no organisms, cultures pending. 4. CBGs 129/196/152. Pre op HGA1C 8. He is a new diabetic. 5. Hyponatremia-sodium this am increased to 130 6. GI-NPO. 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 As discussed with Dr. Prescott Gum, will not restart TFs today 7. PT consult 8. Will stop scheduled Fentanyl and start PCA (after extubation) as  discussed with Dr. Prescott Gum 9. Decrease IVF  Donielle Liston Alba PA-C 09/02/2019 8:31 AM   Patient recovering well after right VATS, drainage of empyema and decortication of right lower lobe.  Chest tube drainage is minimal, 2+ airleak, chest x-ray significantly improved  We will plan to extubate today, start physical therapy and hold tube feeds another 24 hours.  The patient has a permanent gastrostomy feeding tube and is completely n.p.o. after his previous jaw surgery and radiation therapy.  He will need least 2 weeks of IV antibiotics.  Leave central line in for now and transition to PICC line in 2 to 3 days.  patient examined and medical record reviewed,agree with above note. Tharon Aquas Trigt III 09/02/2019

## 2019-09-02 NOTE — Discharge Instructions (Signed)
Thoracoscopy, Care After This sheet gives you information about how to care for yourself after your procedure. Your health care provider may also give you more specific instructions. If you have problems or questions, contact your health care provider. What can I expect after the procedure? After the procedure, it is common to have pain and soreness in the surgical area. Follow these instructions at home: Incision care   Follow instructions from your health care provider about how to take care of your incision. Make sure you: ? Wash your hands with soap and water before you change your bandage (dressing). If soap and water are not available, use hand sanitizer. ? Change your dressing as told by your health care provider. ? Leave stitches (sutures), skin glue, or adhesive strips in place. These skin closures may need to stay in place for 2 weeks or longer. If adhesive strip edges start to loosen and curl up, you may trim the loose edges. Do not remove adhesive strips completely unless your health care provider tells you to do that.  Check your incision areas every day for signs of infection. Check for: ? Redness, swelling, or pain. ? Fluid or blood. ? Warmth. ? Pus or a bad smell.  Do not take baths, swim, or use a hot tub until your health care provider approves. You may take showers. Medicines  Take over-the-counter and prescription medicines only as told by your health care provider.  If you were prescribed an antibiotic medicine, take it as told by your health care provider. Do not stop taking the antibiotic even if you start to feel better.  Do not drive or use heavy machinery while taking prescription pain medicine.  If you are taking prescription pain medicine, take actions to prevent or treat constipation. Your health care provider may recommend that you: ? Drink enough fluid to keep your urine pale yellow. ? Eat foods that are high in fiber, such as fresh fruits and vegetables,  whole grains, and beans. ? Limit foods that are high in fat and processed sugars, such as fried and sweet foods. ? Take an over-the-counter or prescription medicine for constipation. Managing pain, stiffness, and swelling   If directed, put ice on the affected area: ? Put ice in a plastic bag. ? Place a towel between your skin and the bag. ? Leave the ice on for 20 minutes, 2-3 times a day. Preventing lung infection  To prevent pneumonia and to keep your lungs healthy: ? Try to cough often. If it hurts to cough, hold a pillow against your chest as you cough. ? Take deep breaths or do breathing exercises as instructed by your health care provider. ? If you were given an incentive spirometer, use it as directed by your health care provider. General instructions  Do not lift anything that is heavier than 10 lb (4.5 kg), or the limit that you are told, until your health care provider says that it is safe.  Do not use any products that contain nicotine or tobacco, such as cigarettes and e-cigarettes. These can delay healing after surgery. If you need help quitting, ask your health care provider.  Avoid driving until your health care provider approves.  If you have a chest drainage tube, care for it as instructed by your health care provider. Do not travel by airplane after the chest drainage tube is removed until your health care provider approves.  Keep all follow-up visits as told by your health care provider. This is important. Contact   a health care provider if:  You have a fever.  Pain medicines do not ease your pain.  You have redness, swelling, or increasing pain in your incision area.  You develop a cough that does not go away, or you are coughing up mucus that is yellow or green. Get help right away if:  You have fluid, blood, or pus coming from your incision.  There is a bad smell coming from your incision or dressing.  You develop a rash.  You cough up blood.  You  develop light-headedness, or you feel faint.  You have difficulty breathing.  You develop chest pain.  Your heartbeat feels irregular or very fast. These symptoms may represent a serious problem that is an emergency. Do not wait to see if the symptoms will go away. Get medical help right away. Call your local emergency services (911 in the [place] Do not drive yourself to the hospital. Summary  Follow instructions from your health care provider about how to take care of your incision.  Do not drive or use heavy machinery while taking prescription pain medicine.  Leave stitches (sutures), skin glue, or adhesive strips in place.  Check your incision areas every day for signs of infection. This information is not intended to replace advice given to you by your health care provider. Make sure you discuss any questions you have with your health care provider. Document Revised: 03/06/2017 Document Reviewed: 03/03/2017 Elsevier Patient Education  2020 [place].   Blood Glucose Monitoring, Adult Monitoring your blood sugar (glucose) is an important part of managing your diabetes (diabetes mellitus). Blood glucose monitoring involves checking your blood glucose as often as directed and keeping a record (log) of your results over time. Checking your blood glucose regularly and keeping a blood glucose log can:  Help you and your health care provider adjust your diabetes management plan as needed, including your medicines or insulin.  Help you understand how food, exercise, illnesses, and medicines affect your blood glucose.  Let you know what your blood glucose is at any time. You can quickly find out if you have low blood glucose (hypoglycemia) or high blood glucose (hyperglycemia). Your health care provider will set individualized treatment goals for you. Your goals will be based on your age, other medical conditions you have, and how you respond to diabetes treatment. Generally, the goal  of treatment is to maintain the following blood glucose levels:  Before meals (preprandial): 80-130 mg/dL (4.4-7.2 mmol/L).  After meals (postprandial): below 180 mg/dL (10 mmol/L).  A1c level: less than 7%. Supplies needed:  Blood glucose meter.  Test strips for your meter. Each meter has its own strips. You must use the strips that came with your meter.  A needle to prick your finger (lancet). Do not use a lancet more than one time.  A device that holds the lancet (lancing device).  A journal or log book to write down your results. How to check your blood glucose  1. Wash your hands with soap and water. 2. Prick the side of your finger (not the tip) with the lancet. Use a different finger each time. 3. Gently rub the finger until a small drop of blood appears. 4. Follow instructions that come with your meter for inserting the test strip, applying blood to the strip, and using your blood glucose meter. 5. Write down your result and any notes. Some meters allow you to use areas of your body other than your finger (alternative sites) to test  your blood. The most common alternative sites are:  Forearm.  Thigh.  Palm of the hand. If you think you may have hypoglycemia, or if you have a history of not knowing when your blood glucose is getting low (hypoglycemia unawareness), do not use alternative sites. Use your finger instead. Alternative sites may not be as accurate as the fingers, because blood flow is slower in these areas. This means that the result you get may be delayed, and it may be different from the result that you would get from your finger. Follow these instructions at home: Blood glucose log   Every time you check your blood glucose, write down your result. Also write down any notes about things that may be affecting your blood glucose, such as your diet and exercise for the day. This information can help you and your health care provider: ? Look for patterns in your  blood glucose over time. ? Adjust your diabetes management plan as needed.  Check if your meter allows you to download your records to a computer. Most glucose meters store a record of glucose readings in the meter. If you have type 1 diabetes:  Check your blood glucose 2 or more times a day.  Also check your blood glucose: ? Before every insulin injection. ? Before and after exercise. ? Before meals. ? 2 hours after a meal. ? Occasionally between 2:00 a.m. and 3:00 a.m., as directed. ? Before potentially dangerous tasks, like driving or using heavy machinery. ? At bedtime.  You may need to check your blood glucose more often, up to 6-10 times a day, if you: ? Use an insulin pump. ? Need multiple daily injections (MDI). ? Have diabetes that is not well-controlled. ? Are ill. ? Have a history of severe hypoglycemia. ? Have hypoglycemia unawareness. If you have type 2 diabetes:  If you take insulin or other diabetes medicines, check your blood glucose 2 or more times a day.  If you are on intensive insulin therapy, check your blood glucose 4 or more times a day. Occasionally, you may also need to check between 2:00 a.m. and 3:00 a.m., as directed.  Also check your blood glucose: ? Before and after exercise. ? Before potentially dangerous tasks, like driving or using heavy machinery.  You may need to check your blood glucose more often if: ? Your medicine is being adjusted. ? Your diabetes is not well-controlled. ? You are ill. General tips  Always keep your supplies with you.  If you have questions or need help, all blood glucose meters have a 24-hour "hotline" phone number that you can call. You may also contact your health care provider.  After you use a few boxes of test strips, adjust (calibrate) your blood glucose meter by following instructions that came with your meter. Contact a health care provider if:  Your blood glucose is at or above 240 mg/dL (13.3 mmol/L)  for 2 days in a row.  You have been sick or have had a fever for 2 days or longer, and you are not getting better.  You have any of the following problems for more than 6 hours: ? You cannot eat or drink. ? You have nausea or vomiting. ? You have diarrhea. Get help right away if:  Your blood glucose is lower than 54 mg/dL (3 mmol/L).  You become confused or you have trouble thinking clearly.  You have difficulty breathing.  You have moderate or large ketone levels in your urine. Summary  Monitoring your blood sugar (glucose) is an important part of managing your diabetes (diabetes mellitus).  Blood glucose monitoring involves checking your blood glucose as often as directed and keeping a record (log) of your results over time.  Your health care provider will set individualized treatment goals for you. Your goals will be based on your age, other medical conditions you have, and how you respond to diabetes treatment.  Every time you check your blood glucose, write down your result. Also write down any notes about things that may be affecting your blood glucose, such as your diet and exercise for the day. This information is not intended to replace advice given to you by your health care provider. Make sure you discuss any questions you have with your health care provider. Document Revised: 01/15/2018 Document Reviewed: 09/03/2015 Elsevier Patient Education  [place]. Hemoglobin A1c Test Why am I having this test? You may have the hemoglobin A1c test (HbA1c test) done to:  Evaluate your risk for developing diabetes (diabetes mellitus).  Diagnose diabetes.  Monitor long-term control of blood sugar (glucose) in people who have diabetes and help make treatment decisions. This test may be done with other blood glucose tests, such as fasting blood glucose and oral glucose tolerance tests. What is being tested? Hemoglobin is a type of protein in the blood that carries oxygen.  Glucose attaches to hemoglobin to form glycated hemoglobin. This test checks the amount of glycated hemoglobin in your blood, which is a good indicator of the average amount of glucose in your blood during the past 2-3 months. What kind of sample is taken?  A blood sample is required for this test. It is usually collected by inserting a needle into a blood vessel. Tell a health care provider about:  All medicines you are taking, including vitamins, herbs, eye drops, creams, and over-the-counter medicines.  Any blood disorders you have.  Any surgeries you have had.  Any medical conditions you have.  Whether you are pregnant or may be pregnant. How are the results reported? Your results will be reported as a percentage that indicates how much of your hemoglobin has glucose attached to it (is glycated). Your health care provider will compare your results to normal ranges that were established after testing a large group of people (reference ranges). Reference ranges may vary among labs and hospitals. For this test, common reference ranges are:  Adult or child without diabetes: 4-5.6%.  Adult or child with diabetes and good blood glucose control: less than 7%. What do the results mean? If you have diabetes:  A result of less than 7% is considered normal, meaning that your blood glucose is well controlled.  A result higher than 7% means that your blood glucose is not well controlled, and your treatment plan may need to be adjusted. If you do not have diabetes:  A result within the reference range is considered normal, meaning that you are not at high risk for diabetes.  A result of 5.7-6.4% means that you have a high risk of developing diabetes, and you may have prediabetes. Prediabetes is the condition of having a blood glucose level that is higher than it should be, but not high enough for you to be diagnosed with diabetes. Having prediabetes puts you at risk for developing type 2  diabetes (type 2 diabetes mellitus). You may have more tests, including a repeat HbA1c test.  Results of 6.5% or higher on two separate HbA1c tests mean that you have diabetes.  You may have more tests to confirm the diagnosis. Abnormally low HbA1c values may be caused by:  Pregnancy.  Severe blood loss.  Receiving donated blood (transfusions).  Low red blood cell count (anemia).  Long-term kidney failure.  Some unusual forms (variants) of hemoglobin. Talk with your health care provider about what your results mean. Questions to ask your health care provider Ask your health care provider, or the department that is doing the test:  When will my results be ready?  How will I get my results?  What are my treatment options?  What other tests do I need?  What are my next steps? Summary  The hemoglobin A1c test (HbA1c test) may be done to evaluate your risk for developing diabetes, to diagnose diabetes, and to monitor long-term control of blood sugar (glucose) in people who have diabetes and help make treatment decisions.  Hemoglobin is a type of protein in the blood that carries oxygen. Glucose attaches to hemoglobin to form glycated hemoglobin. This test checks the amount of glycated hemoglobin in your blood, which is a good indicator of the average amount of glucose in your blood during the past 2-3 months.  Talk with your health care provider about what your results mean. This information is not intended to replace advice given to you by your health care provider. Make sure you discuss any questions you have with your health care provider. Document Revised: 03/06/2017 Document Reviewed: 11/04/2016 Elsevier Patient Education  [place]. Hyperglycemia Hyperglycemia occurs when the level of sugar (glucose) in the blood is too high. Glucose is a type of sugar that provides the body's main source of energy. Certain hormones (insulin and glucagon) control the level of glucose  in the blood. Insulin lowers blood glucose, and glucagon increases blood glucose. Hyperglycemia can result from having too little insulin in the bloodstream, or from the body not responding normally to insulin. Hyperglycemia occurs most often in people who have diabetes (diabetes mellitus), but it can happen in people who do not have diabetes. It can develop quickly, and it can be life-threatening if it causes you to become severely dehydrated (diabetic ketoacidosis or hyperglycemic hyperosmolar state). Severe hyperglycemia is a medical emergency. What are the causes? If you have diabetes, hyperglycemia may be caused by:  Diabetes medicine.  Medicines that increase blood glucose or affect your diabetes control.  Not eating enough, or not eating often enough.  Changes in physical activity level.  Being sick or having an infection. If you have prediabetes or undiagnosed diabetes:  Hyperglycemia may be caused by those conditions. If you do not have diabetes, hyperglycemia may be caused by:  Certain medicines, including steroid medicines, beta-blockers, epinephrine, and thiazide diuretics.  Stress.  Serious illness.  Surgery.  Diseases of the pancreas.  Infection. What increases the risk? Hyperglycemia is more likely to develop in people who have risk factors for diabetes, such as:  Having a family member with diabetes.  Having a gene for type 1 diabetes that is passed from parent to child (inherited).  Living in an area with cold weather conditions.  Exposure to certain viruses.  Certain conditions in which the body's disease-fighting (immune) system attacks itself (autoimmune disorders).  Being overweight or obese.  Having an inactive (sedentary) lifestyle.  Having been diagnosed with insulin resistance.  Having a history of prediabetes, gestational diabetes, or polycystic ovarian syndrome (PCOS).  Being of American-Indian, African-American, Hispanic/Latino, or  Asian/Pacific Islander descent. What are the signs or symptoms? Hyperglycemia may not  cause any symptoms. If you do have symptoms, they may include early warning signs, such as:  Increased thirst.  Hunger.  Feeling very tired.  Needing to urinate more often than usual.  Blurry vision. Other symptoms may develop if hyperglycemia gets worse, such as:  Dry mouth.  Loss of appetite.  Fruity-smelling breath.  Weakness.  Unexpected or rapid weight gain or weight loss.  Tingling or numbness in the hands or feet.  Headache.  Skin that does not quickly return to normal after being lightly pinched and released (poor skin turgor).  Abdominal pain.  Cuts or bruises that are slow to heal. How is this diagnosed? Hyperglycemia is diagnosed with a blood test to measure your blood glucose level. This blood test is usually done while you are having symptoms. Your health care provider may also do a physical exam and review your medical history. You may have more tests to determine the cause of your hyperglycemia, such as:  A fasting blood glucose (FBG) test. You will not be allowed to eat (you will fast) for at least 8 hours before a blood sample is taken.  An A1c (hemoglobin A1c) blood test. This provides information about blood glucose control over the previous 2-3 months.  An oral glucose tolerance test (OGTT). This measures your blood glucose at two times: ? After fasting. This is your baseline blood glucose level. ? Two hours after drinking a beverage that contains glucose. How is this treated? Treatment depends on the cause of your hyperglycemia. Treatment may include:  Taking medicine to regulate your blood glucose levels. If you take insulin or other diabetes medicines, your medicine or dosage may be adjusted.  Lifestyle changes, such as exercising more, eating healthier foods, or losing weight.  Treating an illness or infection, if this caused your hyperglycemia.  Checking  your blood glucose more often.  Stopping or reducing steroid medicines, if these caused your hyperglycemia. If your hyperglycemia becomes severe and it results in hyperglycemic hyperosmolar state, you must be hospitalized and given IV fluids. Follow these instructions at home:  General instructions  Take over-the-counter and prescription medicines only as told by your health care provider.  Do not use any products that contain nicotine or tobacco, such as cigarettes and e-cigarettes. If you need help quitting, ask your health care provider.  Limit alcohol intake to no more than 1 drink per day for nonpregnant women and 2 drinks per day for men. One drink equals 12 oz of beer, 5 oz of wine, or 1 oz of hard liquor.  Learn to manage stress. If you need help with this, ask your health care provider.  Keep all follow-up visits as told by your health care provider. This is important. Eating and drinking   Maintain a healthy weight.  Exercise regularly, as directed by your health care provider.  Stay hydrated, especially when you exercise, get sick, or spend time in hot temperatures.  Eat healthy foods, such as: ? Lean proteins. ? Complex carbohydrates. ? Fresh fruits and vegetables. ? Low-fat dairy products. ? Healthy fats.  Drink enough fluid to keep your urine clear or pale yellow. If you have diabetes:  Make sure you know the symptoms of hyperglycemia.  Follow your diabetes management plan, as told by your health care provider. Make sure you: ? Take your insulin and medicines as directed. ? Follow your exercise plan. ? Follow your meal plan. Eat on time, and do not skip meals. ? Check your blood glucose as often as  directed. Make sure to check your blood glucose before and after exercise. If you exercise longer or in a different way than usual, check your blood glucose more often. ? Follow your sick day plan whenever you cannot eat or drink normally. Make this plan in advance  with your health care provider.  Share your diabetes management plan with people in your workplace, school, and household.  Check your urine for ketones when you are ill and as told by your health care provider.  Carry a medical alert card or wear medical alert jewelry. Contact a health care provider if:  Your blood glucose is at or above 240 mg/dL (13.3 mmol/L) for 2 days in a row.  You have problems keeping your blood glucose in your target range.  You have frequent episodes of hyperglycemia. Get help right away if:  You have difficulty breathing.  You have a change in how you think, feel, or act (mental status).  You have nausea or vomiting that does not go away. These symptoms may represent a serious problem that is an emergency. Do not wait to see if the symptoms will go away. Get medical help right away. Call your local emergency services (911 in the [place] Do not drive yourself to the hospital. Summary  Hyperglycemia occurs when the level of sugar (glucose) in the blood is too high.  Hyperglycemia is diagnosed with a blood test to measure your blood glucose level. This blood test is usually done while you are having symptoms. Your health care provider may also do a physical exam and review your medical history.  If you have diabetes, follow your diabetes management plan as told by your health care provider.  Contact your health care provider if you have problems keeping your blood glucose in your target range. This information is not intended to replace advice given to you by your health care provider. Make sure you discuss any questions you have with your health care provider. Document Revised: 12/10/2015 Document Reviewed: 12/10/2015 Elsevier Patient Education  [place].

## 2019-09-02 NOTE — Procedures (Signed)
Extubation Procedure Note  Patient Details:   Name: Brandon Miles DOB: [birth year] MRN: [id number]   Airway Documentation:    Vent end date: 09/02/19 Vent end time: 0935   Evaluation  O2 sats: stable throughout Complications: No apparent complications Patient did tolerate procedure well. Bilateral Breath Sounds: Clear, Diminished   No   RT extubated patient to 4L [place] per MD order for rapid wean protocol. RN at bedside. VC 1.1L NIF -40. Positive cuff leak noted. Patient tolerated well. No stridor noted. RT will continue to monitor as needed.   Vernona Rieger 09/02/2019, 9:40 AM

## 2019-09-02 NOTE — Anesthesia Postprocedure Evaluation (Signed)
Anesthesia Post Note  Patient: Brandon Miles  Procedure(s) Performed: VIDEO ASSISTED THORACOSCOPY (VATS)/EMPYEMA  AND DECORTICATION (Right Chest) Insertion Central Line Adult (Left Neck)     Patient location during evaluation: SICU Anesthesia Type: General Level of consciousness: patient remains intubated per anesthesia plan Pain management: pain level controlled Vital Signs Assessment: post-procedure vital signs reviewed and stable Respiratory status: patient remains intubated per anesthesia plan Cardiovascular status: stable Postop Assessment: no apparent nausea or vomiting Anesthetic complications: no    Last Vitals:  Vitals:   09/02/19 0905 09/02/19 0935  BP:    Pulse:  67  Resp:  14  Temp:    SpO2: 100% 100%    Last Pain:  Vitals:   09/02/19 0800  TempSrc:   PainSc: 3    Pain Goal:                   Huston Foley

## 2019-09-02 NOTE — Plan of Care (Signed)
  Problem: Education: Goal: Knowledge of General Education information will improve Description: Including pain rating scale, medication(s)/side effects and non-pharmacologic comfort measures Outcome: Progressing   Problem: Clinical Measurements: Goal: Respiratory complications will improve Outcome: Progressing Goal: Cardiovascular complication will be avoided Outcome: Progressing   Problem: Elimination: Goal: Will not experience complications related to urinary retention Outcome: Progressing   Problem: Pain Managment: Goal: General experience of comfort will improve Outcome: Progressing   Problem: Education: Goal: Knowledge of disease or condition will improve Outcome: Progressing   Problem: Respiratory: Goal: Respiratory status will improve Outcome: Progressing   Problem: Pain Management: Goal: Pain level will decrease Outcome: Progressing

## 2019-09-02 NOTE — Progress Notes (Addendum)
Am ABG results called in to Dr. Roxan Hockey. Please see results review. No new orders given, will continue to monitor.

## 2019-09-02 NOTE — Progress Notes (Signed)
Nutrition Follow-up  DOCUMENTATION CODES:   Severe malnutrition in context of chronic illness  INTERVENTION:   Tube Feeding via PEG: Recommend initiation of Osmolite 1.5 at 20 ml/hr, goal rate of 70 ml/hr Initiate at 20 ml/hr, titrate by 10 mL q 8 hours until goal rate of 70 ml/hr Pro-Stat 30 mL BID Provides 2720 kcals, 135 g of protein and 1277 mL of free water  Hold off on additional free water flushes at this time  Recommend checking phosphorus today; recommend following electrolytes post initiation. Possible refeeding risk given severe malnutrition.   Change liquid MVI to MVI with Minerals daily  Pt will likely require TF insulin coverage once TF initiated; defer to DM coordinator regarding recommendations. Currently only on sliding scale  NUTRITION DIAGNOSIS:   Severe Malnutrition related to chronic illness(throat cancer s/p radiation and surgery) as evidenced by severe fat depletion, severe muscle depletion.  Being addressed via nutrition support  GOAL:   Patient will meet greater than or equal to 90% of their needs  Progressing  MONITOR:   Labs, Weight trends, TF tolerance, Skin, I & O's  REASON FOR ASSESSMENT:   Consult Enteral/tube feeding initiation and management  ASSESSMENT:   69 year old male who presented on 5/27 with a right-sided empyema. 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  5/27 VATS for drainage of empyema and decortication of R. Lung 5/28 Extubated to [facility]  Per MD, plan to hold off on initiation of TF another 24 hours Currently on neosynephrine  Pt is unable to take anything by mouth  RN concerned that pt may be withholding TF at home due to anxiety surrounding BMs.   Chest tubes with minima output, UOP >2 L. G-tube to drainage with 125 mL  Noted hyponatremia upon admission with concern for volume  depletion  Newly dx DM, currently only on sliding scale. Pt may require TF coverage once TF initiated  Weight up to 64 kg post-op; admit weight 58.9 kg. Net +3:. Pt has endorsed weight loss previously but denies wt loss today. However, only previous weight encounter is from 2014 when cancer dx. Weight at that time was 74 kg;  Based on this information, pt with at least 20% wt loss since cancer dx.   Noted throat cancer dx was 9 years ago with PEG placement for nutrition support. Concerned that pt still meets criteria for malnutrition and has not gained weight during this time. Noted pt endorses weight loss over the past several weeks due to worsening symptoms of weakness, SOB, back pain, etc but pt also states he has been administering his TF as instructed  Labs: sodium 130 (L), potassium wdl, magnesium wdl, no phosphorus Meds: NS with KCl at 75 ml/hr, ferrous sulfate, ss novolog, reglan, liquid MVI  Diet Order:   Diet Order            Diet NPO time specified  Diet effective now              EDUCATION NEEDS:   No education needs have been identified at this time  Skin:  Skin Assessment: Reviewed RN Assessment(PEG tube)  Last BM:  5/25  Height:   Ht Readings from Last 1 Encounters:  09/01/19 5' 9.02" (1.753 m)    Weight:   Wt Readings from Last 1 Encounters:  09/02/19 64.2 kg    Ideal Body Weight:  72.7 kg  BMI:  Body mass index is 20.89 kg/m.  Estimated Nutritional Needs:   Kcal:  2500-2800  Protein:  125-150 grams  Fluid:  >/= 2.2 L   Kerman Passey MS, RDN, LDN, CNSC RD Pager Number and RD On-Call Pager Number Located in [place]

## 2019-09-02 NOTE — Op Note (Signed)
NAME: Brandon Miles, Brandon Miles MEDICAL RECORD [id number] ACCOUNT [id number] DATE OF BIRTH:1951-03-16 FACILITY: [facility] LOCATION: [facility] PHYSICIAN:Mykael Batz VAN TRIGT III, MD  OPERATIVE REPORT  DATE OF PROCEDURE:  09/01/2019  OPERATIONS: 1.  Right VATS (video-assisted thoracoscopic surgery) for drainage of right empyema. 2.  Decortication of right lower lobe. 3.  Placement of left subclavian vein central line.  ANESTHESIA:  General.  PREOPERATIVE DIAGNOSIS:   Large right empyema with history of previous tongue, jaw and neck resection and radiation for cancer with inability to swallow or speak with a permanent gastrostomy feeding tube, chronically malnourished and cachectic.  POSTOPERATIVE DIAGNOSIS:  Large right empyema with history of previous tongue, jaw and neck resection and radiation for cancer with inability to swallow or speak with a permanent gastrostomy feeding tube, chronically malnourished and cachectic.  SURGEON:  Ivin Poot, MD  ASSISTANT:  Enid Cutter, PA-C.  ANESTHESIA:  Dr. Ola Spurr.  DESCRIPTION OF PROCEDURE:  The patient was brought from preop holding where informed consent was documented and the proper site was marked and final issues addressed with the patient.  I attempted to call the patient's wife on 2 occasions to let her know  about the surgery, but there was no answer at the phone and no message mechanism.  The patient was brought to the operating room and placed supine on the operating table and general anesthesia was induced.  A single lumen endotracheal tube was used by the anesthesia team because of severe anatomic abnormalities from his previous head  and neck surgery and radiation.  An A-line had been placed by the anesthesia team.  The patient was then turned to expose the right side and the previously placed pigtail catheter in the empyema cavity placed at an outside hospital was removed.  The right chest was then prepped and draped as a sterile  field and a proper time-out was  performed.  A small incision was made in the 5th interspace from the tip of the scapula anteriorly.  The pleural space was identified after carefully dissecting through some pleural adhesions.  This was a large cavity extending almost from the hemidiaphragm to the  apex filled with purulent material.  This was sent for cultures.  This empyema cavity was then irrigated with copious amounts of warm saline.  There was a thick peel on the right lower lobe which entrapped the right lower lobe.  The peel was incised with a 15 blade scalpel and the pleural peel was removed to allow improved reexpansion of the right lung.  This dissection was difficult and the  quality of the lung was very fragile and developed air leaks easily and bled easily.  After some medical adhesive was placed over the raw areas of the lung after the peel was removed (Progel), the empyema cavity was drained with 2 chest tubes placed dependently and anteriorly and brought out through separate incisions and secured to the  skin.  The ribs were reapproximated with 2 pericostal sutures.  The lung was reexpanded and filled the space significantly better.  The chest wall was closed with interrupted #1 Vicryl for the muscle layer, running 2-0 Vicryl for the subcutaneous and interrupted nylon skin sutures.  Sterile dressings were placed and the chest tubes were connected to underwater seal Pleur-Evac  drainage system.  The patient was then rolled supine.  The left subclavian vein was then prepped and draped as a sterile field for central IV access.  I felt that the patient needed IV access because of low  blood pressure during the procedure and the requirement for high doses of phenylephrine to maintain  blood pressure.  The left chest and shoulder were prepped and draped as a sterile field.  Using the Seldinger technique, the left subclavian vein was accessed and a guidewire passed distally.  The dilator was  passed over the guidewire, then the 2 port  central line.  The IV flushed well with venous blood and was secured to the skin and a sterile dressing was applied.  The patient then had a chest x-ray in the operating room which showed the central line to be in good position, no pneumothorax and which also showed the chest tubes to be in good position, the endotracheal tube in good position and improvement in  aeration of the right lung.  The patient was transported back to the ICU still intubated, in hemodynamically stable condition.  VN/NUANCE  D:09/02/2019 T:09/02/2019 JOB:[id number]

## 2019-09-03 ENCOUNTER — Inpatient Hospital Stay (HOSPITAL_COMMUNITY): Payer: No Typology Code available for payment source

## 2019-09-03 LAB — GLUCOSE, CAPILLARY
Glucose-Capillary: 101 mg/dL — ABNORMAL HIGH (ref 70–99)
Glucose-Capillary: 119 mg/dL — ABNORMAL HIGH (ref 70–99)
Glucose-Capillary: 121 mg/dL — ABNORMAL HIGH (ref 70–99)
Glucose-Capillary: 129 mg/dL — ABNORMAL HIGH (ref 70–99)
Glucose-Capillary: 142 mg/dL — ABNORMAL HIGH (ref 70–99)

## 2019-09-03 LAB — MAGNESIUM: Magnesium: 1.8 mg/dL (ref 1.7–2.4)

## 2019-09-03 LAB — COMPREHENSIVE METABOLIC PANEL
ALT: 23 U/L (ref 0–44)
AST: 43 U/L — ABNORMAL HIGH (ref 15–41)
Albumin: 1.8 g/dL — ABNORMAL LOW (ref 3.5–5.0)
Alkaline Phosphatase: 60 U/L (ref 38–126)
Anion gap: 6 (ref 5–15)
BUN: 8 mg/dL (ref 8–23)
CO2: 29 mmol/L (ref 22–32)
Calcium: 7.6 mg/dL — ABNORMAL LOW (ref 8.9–10.3)
Chloride: 99 mmol/L (ref 98–111)
Creatinine, Ser: 0.44 mg/dL — ABNORMAL LOW (ref 0.61–1.24)
GFR calc Af Amer: >60 mL/min (ref >60)
GFR calc non Af Amer: >60 mL/min (ref >60)
Glucose, Bld: 134 mg/dL — ABNORMAL HIGH (ref 70–99)
Potassium: 3.8 mmol/L (ref 3.5–5.1)
Sodium: 134 mmol/L — ABNORMAL LOW (ref 135–145)
Total Bilirubin: 0.3 mg/dL (ref 0.3–1.2)
Total Protein: 5.1 g/dL — ABNORMAL LOW (ref 6.5–8.1)

## 2019-09-03 LAB — ACID FAST SMEAR (AFB, MYCOBACTERIA): Acid Fast Smear: NEGATIVE

## 2019-09-03 LAB — CBC
HCT: 38.3 % — ABNORMAL LOW (ref 39.0–52.0)
Hemoglobin: 12.4 g/dL — ABNORMAL LOW (ref 13.0–17.0)
MCH: 28.8 pg (ref 26.0–34.0)
MCHC: 32.4 g/dL (ref 30.0–36.0)
MCV: 89.1 fL (ref 80.0–100.0)
Platelets: 628 10*3/uL — ABNORMAL HIGH (ref 150–400)
RBC: 4.3 MIL/uL (ref 4.22–5.81)
RDW: 13.2 % (ref 11.5–15.5)
WBC: 15.1 10*3/uL — ABNORMAL HIGH (ref 4.0–10.5)
nRBC: 0 % (ref 0.0–0.2)

## 2019-09-03 LAB — SAR COV2 SEROLOGY (COVID19)AB(IGG),IA: SARS-CoV-2 Ab, IgG: NONREACTIVE

## 2019-09-03 MED ORDER — MORPHINE SULFATE (PF) 2 MG/ML IV SOLN
2.0000 mg | INTRAVENOUS | Status: DC | PRN
  Administered 2019-09-03 – 2019-09-08 (×21): 2 mg via INTRAVENOUS
  Filled 2019-09-03 (×21): qty 1

## 2019-09-03 MED ORDER — SODIUM CHLORIDE 0.9 % IV SOLN: 250.0000 mL | INTRAVENOUS | Status: DC | PRN

## 2019-09-03 MED ORDER — ~~LOC~~ CARDIAC SURGERY, PATIENT & FAMILY EDUCATION: Freq: Once | Status: AC

## 2019-09-03 MED ORDER — SODIUM CHLORIDE 0.9% FLUSH: 3.0000 mL | INTRAVENOUS | Status: DC | PRN

## 2019-09-03 MED ORDER — SODIUM CHLORIDE 0.9% FLUSH
3.0000 mL | Freq: Two times a day (BID) | INTRAVENOUS | Status: DC
  Administered 2019-09-03 – 2019-09-09 (×6): 3 mL via INTRAVENOUS

## 2019-09-03 NOTE — Progress Notes (Addendum)
Pt arrived to rm 05 from ED with chest tube and PEG tube. iniitated tele. Pt alert. VSS. Chest tube hook to suction 20cm. Pt is on 2L nasal canula. CHG performed. Call bell within reach.  Lavenia Atlas, RN

## 2019-09-03 NOTE — Progress Notes (Signed)
      [address]       [place] [address]             [phone]                 2 Days Post-Op Procedure(s) (LRB): VIDEO ASSISTED THORACOSCOPY (VATS)/EMPYEMA  AND DECORTICATION (Right) Insertion Central Line Adult (Left)   Events: Extubated yesterday, off noe _______________________________________________________________ Vitals: BP 105/66   Pulse 78   Temp 97.9 F (36.6 C)   Resp (!) 7   Ht 5' 9.02" (1.753 m)   Wt 65.1 kg   SpO2 95%   BMI 21.18 kg/m   - Neuro: alert NAD  - Cardiovascular: sinus  Drips: none.   CVP:  [4 mmHg-48 mmHg] 7 mmHg  - Pulm: coarse B.  No leak on tubes    ABG    Component Value Date/Time   PHART 7.446 09/02/2019 0931   PCO2ART 44.9 09/02/2019 0931   PO2ART 101 09/02/2019 0931   HCO3 31.1 (H) 09/02/2019 0931   TCO2 32 09/02/2019 0931   O2SAT 98.0 09/02/2019 0931    - Abd: soft, PEG tube to gravity - Extremity: warm  .Intake/Output      05/28 0701 - 05/29 0700 05/29 0701 - 05/30 0700   I.V. (mL/kg) 3098.5 (47.6) 378.9 (5.8)   Blood     Other 260    IV Piggyback 553.4    Total Intake(mL/kg) 3911.9 (60.1) 378.9 (5.8)   Urine (mL/kg/hr) 905 (0.6) 80 (0.4)   Drains 200 7   Blood     Chest Tube 290 20   Total Output 1395 107   Net +2516.9 +271.9           _______________________________________________________________ Labs: CBC Latest Ref Rng & Units 09/03/2019 09/02/2019 09/02/2019  WBC 4.0 - 10.5 K/uL 15.1(H) - -  Hemoglobin 13.0 - 17.0 g/dL 12.4(L) 11.2(L) 13.3  Hematocrit 39.0 - 52.0 % 38.3(L) 33.0(L) 39.0  Platelets 150 - 400 K/uL 628(H) - -   CMP Latest Ref Rng & Units 09/03/2019 09/02/2019 09/02/2019  Glucose 70 - 99 mg/dL 134(H) - -  BUN 8 - 23 mg/dL 8 - -  Creatinine 0.61 - 1.24 mg/dL 0.44(L) - -  Sodium 135 - 145 mmol/L 134(L) 133(L) 133(L)  Potassium 3.5 - 5.1 mmol/L 3.8 3.9 4.1  Chloride 98 - 111 mmol/L 99 - -  CO2 22 - 32 mmol/L 29 - -  Calcium 8.9 - 10.3 mg/dL 7.6(L) - -  Total Protein  6.5 - 8.1 g/dL 5.1(L) - -  Total Bilirubin 0.3 - 1.2 mg/dL 0.3 - -  Alkaline Phos 38 - 126 U/L 60 - -  AST 15 - 41 U/L 43(H) - -  ALT 0 - 44 U/L 23 - -    CXR: stable  _______________________________________________________________  Assessment and Plan: POD 2 s/p R VATS decort  Neuro: pain controlled, will d/c pca CV: stable Pulm: continue pulm toilet.  Continue CT to suction Renal: stable GI: will start PEG tube feeds Heme: stable ID: afebrile, on abx.  WBC up Endo: SSI Dispo: will transfer to [facility], MD 09/03/2019 10:24 AM

## 2019-09-03 NOTE — Progress Notes (Signed)
Remaining 35mL of Fentanyl PCA syringe wasted with Penny Pia, RN in stericycle.

## 2019-09-03 NOTE — Evaluation (Signed)
Physical Therapy Evaluation Patient Details Name: Brandon Miles MRN: [id number] DOB: [birth year] Today's Date: 09/03/2019   History of Present Illness  Pt transferred from [place] with rt empyema and underwent VATS on 5/27. PMH - throat CA, PEG, hep C  Clinical Impression  Pt admitted with above diagnosis and presents to PT with functional limitations due to deficits listed below (See PT problem list). Pt needs skilled PT to maximize independence and safety to allow discharge to home with wife and HHPT if needed.      Follow Up Recommendations Home health PT(may progress and not need this)    Equipment Recommendations  Other (comment)(possibly rollator)    Recommendations for Other Services       Precautions / Restrictions Precautions Precautions: Fall      Mobility  Bed Mobility Overal bed mobility: Needs Assistance Bed Mobility: Supine to Sit;Sit to Supine     Supine to sit: Supervision;HOB elevated Sit to supine: Supervision;HOB elevated   General bed mobility comments: Assist for lines/tubes. Incr time  Transfers Overall transfer level: Needs assistance Equipment used: 4-wheeled walker;None Transfers: Sit to/from Stand Sit to Stand: Min guard         General transfer comment: Assist for safety and lines  Ambulation/Gait Ambulation/Gait assistance: Min guard Gait Distance (Feet): 190 Feet Assistive device: 4-wheeled walker Gait Pattern/deviations: Step-through pattern;Decreased stride length Gait velocity: decr Gait velocity interpretation: 1.31 - 2.62 ft/sec, indicative of limited community ambulator General Gait Details: Assist for safety and lines.  Stairs            Wheelchair Mobility    Modified Rankin (Stroke Patients Only)       Balance Overall balance assessment: Mild deficits observed, not formally tested                                           Pertinent Vitals/Pain Pain Assessment: Faces Faces Pain  Scale: Hurts little more Pain Location: rt chest tube site with mobility Pain Descriptors / Indicators: Grimacing;Guarding Pain Intervention(s): Limited activity within patient's tolerance;Repositioned    Home Living Family/patient expects to be discharged to:: Private residence Living Arrangements: Spouse/significant other Available Help at Discharge: Family;Available PRN/intermittently Type of Home: House Home Access: Stairs to enter Entrance Stairs-Rails: Right Entrance Stairs-Number of Steps: 5 Home Layout: One level Home Equipment: None      Prior Function Level of Independence: Independent               Hand Dominance        Extremity/Trunk Assessment   Upper Extremity Assessment Upper Extremity Assessment: Generalized weakness    Lower Extremity Assessment Lower Extremity Assessment: Generalized weakness       Communication   Communication: Other (comment)(limited vocalizations due to throat cancer)  Cognition Arousal/Alertness: Awake/alert Behavior During Therapy: WFL for tasks assessed/performed Overall Cognitive Status: Within Functional Limits for tasks assessed                                        General Comments General comments (skin integrity, edema, etc.): VSS on 2L of O2    Exercises     Assessment/Plan    PT Assessment Patient needs continued PT services  PT Problem List Decreased strength;Decreased activity tolerance;Decreased balance;Decreased mobility       PT Treatment  Interventions DME instruction;Gait training;Functional mobility training;Therapeutic activities;Therapeutic exercise;Balance training;Patient/family education    PT Goals (Current goals can be found in the Care Plan section)  Acute Rehab PT Goals Patient Stated Goal: not stated PT Goal Formulation: With patient Time For Goal Achievement: 09/17/19 Potential to Achieve Goals: Good    Frequency Min 3X/week   Barriers to discharge         Co-evaluation               AM-PAC PT "6 Clicks" Mobility  Outcome Measure Help needed turning from your back to your side while in a flat bed without using bedrails?: None Help needed moving from lying on your back to sitting on the side of a flat bed without using bedrails?: None Help needed moving to and from a bed to a chair (including a wheelchair)?: A Little Help needed standing up from a chair using your arms (e.g., wheelchair or bedside chair)?: A Little Help needed to walk in hospital room?: A Little Help needed climbing 3-5 steps with a railing? : A Little 6 Click Score: 20    End of Session Equipment Utilized During Treatment: Oxygen Activity Tolerance: Patient tolerated treatment well Patient left: in bed;with call bell/phone within reach;with bed alarm set Nurse Communication: Mobility status PT Visit Diagnosis: Unsteadiness on feet (R26.81);Muscle weakness (generalized) (M62.81)    Time: [id number] PT Time Calculation (min) (ACUTE ONLY): 36 min   Charges:   PT Evaluation $PT Eval Moderate Complexity: 1 Mod PT Treatments $Gait Training: 8-22 mins        [place] Pager [phone] Office [place] 09/03/2019, 3:02 PM

## 2019-09-03 NOTE — Plan of Care (Signed)
  Problem: Clinical Measurements: Goal: Cardiovascular complication will be avoided Outcome: Progressing Note: No longer requiring pressor support     Problem: Activity: Goal: Risk for activity intolerance will decrease Outcome: Progressing   Problem: Pain Managment: Goal: General experience of comfort will improve Outcome: Progressing   Problem: Metabolic: Goal: Ability to maintain appropriate glucose levels will improve Outcome: Progressing   Problem: Cardiac: Goal: Will achieve and/or maintain hemodynamic stability Outcome: Progressing   Problem: Pain Management: Goal: Pain level will decrease Outcome: Progressing   Problem: Skin Integrity: Goal: Wound healing without signs and symptoms infection will improve Outcome: Progressing   Problem: Nutritional: Goal: Maintenance of adequate nutrition will improve Outcome: Not Progressing Note: Pt PEG tube open to gravity. Tube feed not infusing.

## 2019-09-04 ENCOUNTER — Inpatient Hospital Stay (HOSPITAL_COMMUNITY): Payer: No Typology Code available for payment source

## 2019-09-04 LAB — BASIC METABOLIC PANEL
Anion gap: 6 (ref 5–15)
BUN: <5 mg/dL — ABNORMAL LOW (ref 8–23)
CO2: 29 mmol/L (ref 22–32)
Calcium: 7.5 mg/dL — ABNORMAL LOW (ref 8.9–10.3)
Chloride: 100 mmol/L (ref 98–111)
Creatinine, Ser: 0.41 mg/dL — ABNORMAL LOW (ref 0.61–1.24)
GFR calc Af Amer: >60 mL/min (ref >60)
GFR calc non Af Amer: >60 mL/min (ref >60)
Glucose, Bld: 101 mg/dL — ABNORMAL HIGH (ref 70–99)
Potassium: 3.6 mmol/L (ref 3.5–5.1)
Sodium: 135 mmol/L (ref 135–145)

## 2019-09-04 LAB — CBC
HCT: 36.1 % — ABNORMAL LOW (ref 39.0–52.0)
Hemoglobin: 11.4 g/dL — ABNORMAL LOW (ref 13.0–17.0)
MCH: 28.6 pg (ref 26.0–34.0)
MCHC: 31.6 g/dL (ref 30.0–36.0)
MCV: 90.7 fL (ref 80.0–100.0)
Platelets: 366 10*3/uL (ref 150–400)
RBC: 3.98 MIL/uL — ABNORMAL LOW (ref 4.22–5.81)
RDW: 13.2 % (ref 11.5–15.5)
WBC: 6.2 10*3/uL (ref 4.0–10.5)
nRBC: 0 % (ref 0.0–0.2)

## 2019-09-04 LAB — PHOSPHORUS
Phosphorus: 2 mg/dL — ABNORMAL LOW (ref 2.5–4.6)
Phosphorus: 1.7 mg/dL — ABNORMAL LOW (ref 2.5–4.6)

## 2019-09-04 LAB — GLUCOSE, CAPILLARY
Glucose-Capillary: 101 mg/dL — ABNORMAL HIGH (ref 70–99)
Glucose-Capillary: 115 mg/dL — ABNORMAL HIGH (ref 70–99)
Glucose-Capillary: 130 mg/dL — ABNORMAL HIGH (ref 70–99)
Glucose-Capillary: 207 mg/dL — ABNORMAL HIGH (ref 70–99)
Glucose-Capillary: 98 mg/dL (ref 70–99)

## 2019-09-04 LAB — MAGNESIUM: Magnesium: 1.8 mg/dL (ref 1.7–2.4)

## 2019-09-04 MED ORDER — OSMOLITE 1.5 CAL PO LIQD
1000.0000 mL | ORAL | Status: DC
  Administered 2019-09-04 – 2019-09-07 (×3): 1000 mL
  Filled 2019-09-04 (×9): qty 1000

## 2019-09-04 MED ORDER — PRO-STAT SUGAR FREE PO LIQD
30.0000 mL | Freq: Two times a day (BID) | ORAL | Status: DC
  Administered 2019-09-04 – 2019-09-08 (×9): 30 mL
  Filled 2019-09-04 (×9): qty 30

## 2019-09-04 MED ORDER — VITAL HIGH PROTEIN PO LIQD
1000.0000 mL | ORAL | Status: DC
Start: 2019-09-04 — End: 2019-09-04

## 2019-09-04 MED ORDER — FERROUS SULFATE 300 (60 FE) MG/5ML PO SYRP
300.0000 mg | ORAL_SOLUTION | Freq: Every day | ORAL | Status: DC
  Administered 2019-09-04 – 2019-09-09 (×6): 300 mg
  Filled 2019-09-04 (×6): qty 5

## 2019-09-04 MED ORDER — MAGNESIUM SULFATE 2 GM/50ML IV SOLN
2.0000 g | Freq: Once | INTRAVENOUS | Status: AC
  Administered 2019-09-04: 2 g via INTRAVENOUS
  Filled 2019-09-04: qty 50

## 2019-09-04 NOTE — Progress Notes (Addendum)
Nutrition Follow-up  RD working remotely.  DOCUMENTATION CODES:   Severe malnutrition in context of chronic illness  INTERVENTION:   Initiate Osmolite 1.5 @ 20 ml/hr via PEG and increase by 10 ml every 8 hours to goal rate of 70 ml/hr.   30 ml Prostat BID.    Tube feeding regimen provides 2720 kcal (100% of needs), 235 grams of protein, and 1277 ml of H2O.   -Continue MVI with minerals daily  -Recommend monitoring Mg, K, and Phos daily and repleting as necessary secondary to high refeeding risk   NUTRITION DIAGNOSIS:   Severe Malnutrition related to chronic illness(throat cancer s/p radiation and surgery) as evidenced by severe fat depletion, severe muscle depletion.  Ongoing  GOAL:   Patient will meet greater than or equal to 90% of their needs  Progressing   MONITOR:   Labs, Weight trends, TF tolerance, Skin, I & O's  REASON FOR ASSESSMENT:   Consult Enteral/tube feeding initiation and management  ASSESSMENT:   69 year old male who presented on 5/27 with a right-sided empyema. 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  5/27 VATS for drainage of empyema and decortication of R. Lung 5/28 Extubated to [place] I/O's: +984 ml x 24 hours and +5.9 L since admission  UOP: 740 ml x 24 hours  Drain output: 85 ml x 24 hours  Chest tube output: 220 ml x 24 hours  Per CVTS notes, plan to resume TF today.   Medications reviewed and include reglan, senna, vancomycin, and 0.9% NaCl with KCl 20 mEq/L infusion @ 75 ml/hr.   Labs reviewed: CBGS: 98-130 (inpatient orders for glycemic control are 0-24 units insulin aspart every 6 hours).   Diet Order:   Diet Order            Diet NPO time specified  Diet effective now              EDUCATION NEEDS:   No education needs have been identified at this time  Skin:  Skin Assessment:  Skin Integrity Issues: Skin Integrity Issues:: Incisions Incisions: rt chest  Last BM:  08/30/19  Height:   Ht Readings from Last 1 Encounters:  09/01/19 5' 9.02" (1.753 m)    Weight:   Wt Readings from Last 1 Encounters:  09/04/19 67 kg    Ideal Body Weight:  72.7 kg  BMI:  Body mass index is 21.8 kg/m.  Estimated Nutritional Needs:   Kcal:  2500-2800  Protein:  125-150 grams  Fluid:  >/= 2.2 L    Loistine Chance, RD, LDN, [place] Registered Dietitian II Certified Diabetes Care and Education Specialist Please refer to [facility] for RD and/or RD on-call/weekend/after hours pager

## 2019-09-04 NOTE — Progress Notes (Addendum)
      [address]       [place] [address]             [phone]      3 Days Post-Op Procedure(s) (LRB): VIDEO ASSISTED THORACOSCOPY (VATS)/EMPYEMA  AND DECORTICATION (Right) Insertion Central Line Adult (Left) Subjective: Difficult to understand at times. Pain around the chest tube sites. He has morphine ordered.   Objective: Vital signs in last 24 hours: Temp:  [97.4 F (36.3 C)-98.1 F (36.7 C)] 97.4 F (36.3 C) (05/30 0818) Pulse Rate:  [69-97] 81 (05/30 0818) Cardiac Rhythm: Normal sinus rhythm (05/30 0809) Resp:  [5-20] 16 (05/30 0818) BP: (88-152)/(56-89) 128/78 (05/30 0818) SpO2:  [90 %-100 %] 99 % (05/30 0818) Arterial Line BP: (107-142)/(49-66) 136/65 (05/29 1200) Weight:  [67 kg] 67 kg (05/30 0500)     Intake/Output from previous day: 05/29 0701 - 05/30 0700 In: 2029.3 [I.V.:1730; IV Piggyback:199.3] Out: 1045 [Urine:740; Drains:85; Chest Tube:220] Intake/Output this shift: Total I/O In: -  Out: 400 [Urine:400]  General appearance: alert, cooperative and no distress Heart: regular rate and rhythm, S1, S2 normal, no murmur, click, rub or gallop Lungs: loud rhonchi bilaterally and in all fields Abdomen: soft, non-tender; bowel sounds normal; no masses,  no organomegaly Extremities: extremities normal, atraumatic, no cyanosis or edema Wound: clean and dry  Lab Results: Recent Labs    09/03/19 0332 09/04/19 0422  WBC 15.1* 6.2  HGB 12.4* 11.4*  HCT 38.3* 36.1*  PLT 628* 366   BMET:  Recent Labs    09/03/19 0332 09/04/19 0422  NA 134* 135  K 3.8 3.6  CL 99 100  CO2 29 29  GLUCOSE 134* 101*  BUN 8 <5*  CREATININE 0.44* 0.41*  CALCIUM 7.6* 7.5*    PT/INR: No results for input(s): LABPROT, INR in the last 72 hours. ABG    Component Value Date/Time   PHART 7.446 09/02/2019 0931   HCO3 31.1 (H) 09/02/2019 0931   TCO2 32 09/02/2019 0931   O2SAT 98.0 09/02/2019 0931   CBG (last 3)  Recent Labs    09/03/19 1834  09/04/19 0032 09/04/19 0551  GLUCAP 142* 98 115*    Assessment/Plan: S/P Procedure(s) (LRB): VIDEO ASSISTED THORACOSCOPY (VATS)/EMPYEMA  AND DECORTICATION (Right) Insertion Central Line Adult (Left)  1. No CXR today-will order 2. Two chest tubes in place. 220cc out total yesterday. No drainage recorded for today 3. PT eval recommending PT home health 4. GI-PEG tube in place but no TF started. Patient is NPO  5. WBC down to 6.2 today. Continue Meropenem and Vancomycin  Plan: Will clarify about the tube feedings. Can maybe get one chest tube out today however, too much drainage to pull both tube today. Reminded patient about available pain medication.    LOS: 4 days    Elgie Collard 09/04/2019  Agree with above. We will remove one chest tube today We will start tube feeds today  H.Johnita Palleschi, MD

## 2019-09-04 NOTE — Progress Notes (Signed)
Called x3 times for kangaroo pump for tube feed. No response. Will continue to monitor.  Eduard Clos, RN

## 2019-09-04 NOTE — Progress Notes (Addendum)
Anterior chest tube removed. Patient tolerated well. Minimal air leak after removal. PA notified. Will continue to monitor. Paulene Floor, RN

## 2019-09-05 ENCOUNTER — Inpatient Hospital Stay (HOSPITAL_COMMUNITY): Payer: No Typology Code available for payment source

## 2019-09-05 LAB — BODY FLUID CULTURE: Culture: NO GROWTH

## 2019-09-05 LAB — BPAM RBC
Blood Product Expiration Date: 202106182359
Blood Product Expiration Date: 202106182359
ISSUE DATE / TIME: 202105271613
ISSUE DATE / TIME: 202105271613
Unit Type and Rh: 6200
Unit Type and Rh: 6200

## 2019-09-05 LAB — BASIC METABOLIC PANEL
Anion gap: 5 (ref 5–15)
BUN: 7 mg/dL — ABNORMAL LOW (ref 8–23)
CO2: 31 mmol/L (ref 22–32)
Calcium: 7.3 mg/dL — ABNORMAL LOW (ref 8.9–10.3)
Chloride: 98 mmol/L (ref 98–111)
Creatinine, Ser: 0.46 mg/dL — ABNORMAL LOW (ref 0.61–1.24)
GFR calc Af Amer: >60 mL/min (ref >60)
GFR calc non Af Amer: >60 mL/min (ref >60)
Glucose, Bld: 143 mg/dL — ABNORMAL HIGH (ref 70–99)
Potassium: 4 mmol/L (ref 3.5–5.1)
Sodium: 134 mmol/L — ABNORMAL LOW (ref 135–145)

## 2019-09-05 LAB — GLUCOSE, CAPILLARY
Glucose-Capillary: 149 mg/dL — ABNORMAL HIGH (ref 70–99)
Glucose-Capillary: 150 mg/dL — ABNORMAL HIGH (ref 70–99)
Glucose-Capillary: 157 mg/dL — ABNORMAL HIGH (ref 70–99)
Glucose-Capillary: 168 mg/dL — ABNORMAL HIGH (ref 70–99)
Glucose-Capillary: 184 mg/dL — ABNORMAL HIGH (ref 70–99)
Glucose-Capillary: 197 mg/dL — ABNORMAL HIGH (ref 70–99)
Glucose-Capillary: 200 mg/dL — ABNORMAL HIGH (ref 70–99)

## 2019-09-05 LAB — VANCOMYCIN, TROUGH: Vancomycin Tr: 8 ug/mL — ABNORMAL LOW (ref 15–20)

## 2019-09-05 LAB — MAGNESIUM: Magnesium: 2 mg/dL (ref 1.7–2.4)

## 2019-09-05 LAB — TYPE AND SCREEN
ABO/RH(D): A POS
Antibody Screen: NEGATIVE
Unit division: 0
Unit division: 0

## 2019-09-05 LAB — PHOSPHORUS: Phosphorus: 1.8 mg/dL — ABNORMAL LOW (ref 2.5–4.6)

## 2019-09-05 MED ORDER — VANCOMYCIN HCL 1250 MG/250ML IV SOLN
1250.0000 mg | Freq: Two times a day (BID) | INTRAVENOUS | Status: DC
  Administered 2019-09-06 – 2019-09-07 (×3): 1250 mg via INTRAVENOUS
  Filled 2019-09-05 (×5): qty 250

## 2019-09-05 MED ORDER — SODIUM PHOSPHATES 45 MMOLE/15ML IV SOLN
30.0000 mmol | Freq: Once | INTRAVENOUS | Status: AC
  Administered 2019-09-05: 30 mmol via INTRAVENOUS
  Filled 2019-09-05: qty 10

## 2019-09-05 NOTE — Progress Notes (Signed)
Pharmacy Antibiotic Note  Brandon Miles is a 69 y.o. male admitted on 08/31/2019 with empyema.  Patient was started on vancomycin and Zosyn at [facility] (see previous pharmacy note). Pharmacy has been consulted for Vancomycin and Meropenem dosing.  Patient remains afebrile with wbc wnl. Renal function is stable. Vancomycin trough today was subtherapeutic (8) on vancomycin 750mg  IV Q12 hrs. Level was drawn correctly.   Plan: Increase Vancomycin to 1250mg  IV Q12 hrs (Goal trough 15-20) Continue Meropenem 1gm IV q8h Will f/u renal function, micro data, and pt's clinical condition Check vancomycin trough at steady state   Height: 5' 9.02" (175.3 cm) Weight: 66.1 kg (145 lb 11.6 oz) IBW/kg (Calculated) : 70.74  Temp (24hrs), Avg:97.6 F (36.4 C), Min:97.1 F (36.2 C), Max:98 F (36.7 C)  Recent Labs  Lab 09/01/19 0017 09/01/19 0017 09/01/19 [id number] 09/02/19 0306 09/03/19 0332 09/04/19 0422 09/05/19 0430 09/05/19 0957  WBC 10.9*  --   --  16.1* 15.1* 6.2  --   --   CREATININE 0.59*   < > 0.55* 0.48* 0.44* 0.41*  --  0.46*  LATICACIDVEN 1.1  --   --   --   --   --   --   --   VANCOTROUGH  --   --   --   --   --   --  8*  --    < > = values in this interval not displayed.    Estimated Creatinine Clearance: 82.6 mL/min (A) (by C-G formula based on SCr of 0.46 mg/dL (L)).    Allergies  Allergen Reactions  . Codeine     REACTION: vomitting    Antimicrobials this admission: 5/25 Oval Linsey) Zosyn >>5/27 5/25 (started at [facility]) Vancomycin >>  5/27 Meropenem >>    Microbiology results: 5/27 pleural Cx: ngtd  Richardine Service, PharmD PGY1 Pharmacy Resident Phone: [phone] 09/05/2019  10:45 AM  Please check [email or web address] for unit-specific pharmacy phone numbers.

## 2019-09-05 NOTE — Progress Notes (Signed)
PT Cancellation Note  Patient Details Name: Brandon Miles MRN: [id number] DOB: March 24, 1951   Cancelled Treatment:    Reason Eval/Treat Not Completed: Patient declined, no reason specified Pt reports not feeling like moving today despite explanation on importance of mobility. Will follow.   Marguarite Arbour A Harpreet Pompey 09/05/2019, 9:01 AM Marisa Severin, PT, DPT Acute Rehabilitation Services Pager [phone] Office [phone]

## 2019-09-05 NOTE — Progress Notes (Addendum)
Brandon Miles       [place] [address]             [phone]      4 Days Post-Op Procedure(s) (LRB): VIDEO ASSISTED THORACOSCOPY (VATS)/EMPYEMA  AND DECORTICATION (Right) Insertion Central Line Adult (Left) Subjective: Feels okay this morning. Having some pain around the chest tube site  Objective: Vital signs in last 24 hours: Temp:  [97.1 F (36.2 C)-97.8 F (36.6 C)] 97.7 F (36.5 C) (05/31 0416) Pulse Rate:  [89-99] 89 (05/31 0416) Cardiac Rhythm: Normal sinus rhythm (05/31 0730) Resp:  [13-18] 13 (05/31 0416) BP: (114-134)/(69-80) 127/76 (05/31 0416) SpO2:  [92 %-96 %] 96 % (05/31 0416) Weight:  [66.1 kg] 66.1 kg (05/31 0416)     Intake/Output from previous day: 05/30 0701 - 05/31 0700 In: 3216.8 [I.V.:1866.1; NG/GT:890; IV Piggyback:400.7] Out: 1245 [Urine:1175; Chest Tube:70] Intake/Output this shift: No intake/output data recorded.  General appearance: ill-appearing, thin Heart: regular rate and rhythm, S1, S2 normal, no murmur, click, rub or gallop Lungs: coarse rhonchi in all fields Abdomen: soft, non-tender; bowel sounds normal; no masses,  no organomegaly Extremities: extremities normal, atraumatic, no cyanosis or edema Wound: clean and dry  Lab Results: Recent Labs    09/03/19 0332 09/04/19 0422  WBC 15.1* 6.2  HGB 12.4* 11.4*  HCT 38.3* 36.1*  PLT 628* 366   BMET:  Recent Labs    09/03/19 0332 09/04/19 0422  NA 134* 135  K 3.8 3.6  CL 99 100  CO2 29 29  GLUCOSE 134* 101*  BUN 8 <5*  CREATININE 0.44* 0.41*  CALCIUM 7.6* 7.5*    PT/INR: No results for input(s): LABPROT, INR in the last 72 hours. ABG    Component Value Date/Time   PHART 7.446 09/02/2019 0931   HCO3 31.1 (H) 09/02/2019 0931   TCO2 32 09/02/2019 0931   O2SAT 98.0 09/02/2019 0931   CBG (last 3)  Recent Labs    09/05/19 0126 09/05/19 0414 09/05/19 0626  GLUCAP 157* 150* 184*    Assessment/Plan: S/P Procedure(s) (LRB): VIDEO ASSISTED  THORACOSCOPY (VATS)/EMPYEMA  AND DECORTICATION (Right) Insertion Central Line Adult (Left)   1. CXR is stable. No large pleural effusion or pneumo 2. One chest tubes in place. 70cc out of remaining chest tube. 3. PT eval recommending PT home health 4. GI-PEG tube in place and nutrition started TF yesterday.  Patient is NPO  5. WBC down to 6.2 today. Continue Meropenem and Vancomycin  Plan: Continue TF per nutrition. Continue IV abx. May be able to get remaining chest tube removed today. Needs more work for deconditioning.    LOS: 5 days    Elgie Collard 09/05/2019  DG Chest 1 View  Result Date: 09/05/2019 CLINICAL DATA:  Follow-up chest tube EXAM: CHEST  1 VIEW COMPARISON:  Chest radiograph from one day prior. FINDINGS: Left subclavian central venous catheter terminates at the junction of the left brachiocephalic vein and SVC. Interval removal of 1 of the 2 right chest tubes, with residual solitary right upper chest tube in place. Stable cardiomediastinal silhouette with top-normal heart size. Small right hydropneumothorax is overall mildly increased with new basilar right pneumothorax component. No left pneumothorax. No left pleural effusion. Persistent patchy right lung base opacity, unchanged. No overt pulmonary edema. IMPRESSION: 1. Overall mildly increased small right hydropneumothorax with new basilar right pneumothorax component status post removal of 1 of the 2 right chest tubes. 2. Stable patchy right lung base opacity,  favor atelectasis. Electronically Signed   By: Ilona Sorrel M.D.   On: 09/05/2019 08:49   Leave chest tube with some increase in ptx I have seen and examined Brandon Miles and agree with the above assessment  and plan.  Grace Isaac MD Beeper [phone] Office [phone] 09/05/2019 12:10 PM

## 2019-09-06 ENCOUNTER — Encounter (HOSPITAL_COMMUNITY): Payer: No Typology Code available for payment source

## 2019-09-06 ENCOUNTER — Inpatient Hospital Stay (HOSPITAL_COMMUNITY): Payer: No Typology Code available for payment source

## 2019-09-06 LAB — GLUCOSE, CAPILLARY
Glucose-Capillary: 118 mg/dL — ABNORMAL HIGH (ref 70–99)
Glucose-Capillary: 191 mg/dL — ABNORMAL HIGH (ref 70–99)
Glucose-Capillary: 148 mg/dL — ABNORMAL HIGH (ref 70–99)
Glucose-Capillary: 136 mg/dL — ABNORMAL HIGH (ref 70–99)
Glucose-Capillary: 110 mg/dL — ABNORMAL HIGH (ref 70–99)

## 2019-09-06 LAB — BASIC METABOLIC PANEL
Anion gap: 5 (ref 5–15)
BUN: 7 mg/dL — ABNORMAL LOW (ref 8–23)
CO2: 34 mmol/L — ABNORMAL HIGH (ref 22–32)
Calcium: 7.3 mg/dL — ABNORMAL LOW (ref 8.9–10.3)
Chloride: 97 mmol/L — ABNORMAL LOW (ref 98–111)
Creatinine, Ser: 0.35 mg/dL — ABNORMAL LOW (ref 0.61–1.24)
GFR calc Af Amer: >60 mL/min (ref >60)
GFR calc non Af Amer: >60 mL/min (ref >60)
Glucose, Bld: 108 mg/dL — ABNORMAL HIGH (ref 70–99)
Potassium: 3.9 mmol/L (ref 3.5–5.1)
Sodium: 136 mmol/L (ref 135–145)

## 2019-09-06 LAB — MAGNESIUM: Magnesium: 1.9 mg/dL (ref 1.7–2.4)

## 2019-09-06 LAB — PHOSPHORUS: Phosphorus: 2.2 mg/dL — ABNORMAL LOW (ref 2.5–4.6)

## 2019-09-06 MED ORDER — ENOXAPARIN SODIUM 30 MG/0.3ML ~~LOC~~ SOLN
30.0000 mg | SUBCUTANEOUS | Status: DC
  Administered 2019-09-06 – 2019-09-07 (×2): 30 mg via SUBCUTANEOUS
  Filled 2019-09-06 (×2): qty 0.3

## 2019-09-06 NOTE — Progress Notes (Addendum)
      [address]       [place], [address]             [phone]       5 Days Post-Op Procedure(s) (LRB): VIDEO ASSISTED THORACOSCOPY (VATS)/EMPYEMA  AND DECORTICATION (Right) Insertion Central Line Adult (Left)  Subjective: Patient states pain on right side (from chest tube) not too bad this am.  Objective: Vital signs in last 24 hours: Temp:  [97.7 F (36.5 C)-98.6 F (37 C)] 98.4 F (36.9 C) (06/01 0357) Pulse Rate:  [83-98] 91 (06/01 0357) Cardiac Rhythm: Sinus tachycardia (05/31 2000) Resp:  [15-19] 15 (06/01 0357) BP: (92-135)/(61-89) 124/75 (06/01 0357) SpO2:  [94 %-96 %] 96 % (06/01 0357) Weight:  [67.9 kg] 67.9 kg (06/01 0357)      Intake/Output from previous day: 05/31 0701 - 06/01 0700 In: -  Out: 3285 [Urine:2275; Chest Tube:210]   Physical Exam:  Cardiovascular: Slightly tachycardic Pulmonary: Rhonchi this am Abdomen: Soft, non tender, bowel sounds present, Gastrostomy tube in place Extremities: No lower extremity edema. Wounds: Clean and dry.  No erythema or signs of infection. Chest Tube: to suction, no air leak  Lab Results: CBC: Recent Labs    09/04/19 0422  WBC 6.2  HGB 11.4*  HCT 36.1*  PLT 366   BMET:  Recent Labs    09/05/19 0957 09/06/19 0435  NA 134* 136  K 4.0 3.9  CL 98 97*  CO2 31 34*  GLUCOSE 143* 108*  BUN 7* 7*  CREATININE 0.46* 0.35*  CALCIUM 7.3* 7.3*    PT/INR: No results for input(s): LABPROT, INR in the last 72 hours. ABG:  INR: Will add last result for INR, ABG once components are confirmed Will add last 4 CBG results once components are confirmed  Assessment/Plan:  1. CV - Slightly tachycardic with HR in the low 100's.  2.  Pulmonary - On room air. Chest tube with 210 cc last 24 hours. Chest tube is to suction and there is no air leak. NO CXR ordered so will order. Encourage incentive spirometer. 3. ID-on Vancomycin and Meropenem 4. CBGs 168/149/118. Pre op HGA1C 8. O Insulin 5.  Supplement potassium 6. Anemia-Last H and H 11.4 and 36.1. On Ferrous sulfate 7. GI-NPO, TFs at 70 ml/hr 8. Severely deconditioned-PT  Donielle M ZimmermanPA-C 09/06/2019,7:06 AM [id number]  Place chest tube to water seal and check CXR in am  L arm with swelling c/w infiltrated IV but Duplex scan ordered to rule out DVT - still not done  patient examined and medical record reviewed,agree with above note. Tharon Aquas Trigt III 09/06/2019

## 2019-09-06 NOTE — Progress Notes (Signed)
Inpatient Diabetes Program Recommendations  AACE/ADA: New Consensus Statement on Inpatient Glycemic Control (2015)  Target Ranges:  Prepandial:   less than 140 mg/dL      Peak postprandial:   less than 180 mg/dL (1-2 hours)      Critically ill patients:  140 - 180 mg/dL   Lab Results  Component Value Date   GLUCAP 136 (H) 09/06/2019   HGBA1C 8.0 (H) 09/01/2019    Review of Glycemic Control  Diabetes history: New-onset DM Outpatient Diabetes medications: None Current orders for Inpatient glycemic control: Novolog 0-24 units Q6H  HgbA1C - 8.0%  Inpatient Diabetes Program Recommendations:     Agree with orders.  Spoke with pt this afternoon about his new diagnosis of DM and HgbA1C of 8%. Pt was preoccupied with L arm pain, irritated and did not want to talk. Refused to use the white board. Will try again to discuss his diabetes diagnosis.  Continue to follow.  Thank you. Lorenda Peck, RD, LDN, CDE Inpatient Diabetes Coordinator [phone]

## 2019-09-06 NOTE — Progress Notes (Signed)
Physical Therapy Treatment Patient Details Name: Brandon Miles MRN: [id number] DOB: 01-16-1951 Today's Date: 09/06/2019    History of Present Illness Pt transferred from [place] with rt empyema and underwent VATS on 5/27. PMH - throat CA, PEG, hep C    PT Comments    Patient progressing well towards PT goals. Improved ambulation distance with Min guard assist for safety/lines. 1/4 DOE noted with max HR up to 115 bpm. Sp02 >90% on RA. Reporting pain at chest tube site with all mobility especially bed mobility. RN aware for pain medication request. Encouraged increasing activity while in the hospital. Will follow.    Follow Up Recommendations  Home health PT(may progress and not need it)     Equipment Recommendations  Other (comment)(possibly rollator?)    Recommendations for Other Services       Precautions / Restrictions Precautions Precautions: Fall Precaution Comments: PEG tube, CT Restrictions Weight Bearing Restrictions: No    Mobility  Bed Mobility Overal bed mobility: Needs Assistance Bed Mobility: Supine to Sit;Sit to Supine     Supine to sit: Supervision;HOB elevated Sit to supine: Supervision;HOB elevated   General bed mobility comments: Assist for lines/tubes. Incr time  Transfers Overall transfer level: Needs assistance Equipment used: Rolling walker (2 wheeled) Transfers: Sit to/from Stand Sit to Stand: Min guard         General transfer comment: Min guard for safety, managed lines  Ambulation/Gait Ambulation/Gait assistance: Min guard Gait Distance (Feet): 200 Feet Assistive device: Rolling walker (2 wheeled) Gait Pattern/deviations: Step-through pattern;Decreased stride length Gait velocity: decr Gait velocity interpretation: 1.31 - 2.62 ft/sec, indicative of limited community ambulator General Gait Details: Slow, steady gait with RW, managed lines. 1/4 DOE. VSS , HR up to 115 bpm max.   Stairs             Wheelchair Mobility     Modified Rankin (Stroke Patients Only)       Balance Overall balance assessment: Mild deficits observed, not formally tested                                          Cognition Arousal/Alertness: Awake/alert Behavior During Therapy: WFL for tasks assessed/performed Overall Cognitive Status: Within Functional Limits for tasks assessed                                        Exercises      General Comments General comments (skin integrity, edema, etc.): VSS on RA.      Pertinent Vitals/Pain Pain Assessment: Faces Faces Pain Scale: Hurts even more Pain Location: rt chest tube site with mobility Pain Descriptors / Indicators: Grimacing;Guarding Pain Intervention(s): Monitored during session;Repositioned;Patient requesting pain meds-RN notified    Home Living                      Prior Function            PT Goals (current goals can now be found in the care plan section) Progress towards PT goals: Progressing toward goals    Frequency    Min 3X/week      PT Plan Current plan remains appropriate    Co-evaluation              AM-PAC PT "6 Clicks" Mobility  Outcome Measure  Help needed turning from your back to your side while in a flat bed without using bedrails?: None Help needed moving from lying on your back to sitting on the side of a flat bed without using bedrails?: None Help needed moving to and from a bed to a chair (including a wheelchair)?: A Little Help needed standing up from a chair using your arms (e.g., wheelchair or bedside chair)?: A Little Help needed to walk in hospital room?: A Little Help needed climbing 3-5 steps with a railing? : A Little 6 Click Score: 20    End of Session   Activity Tolerance: Patient tolerated treatment well Patient left: in bed;with call bell/phone within reach;with bed alarm set Nurse Communication: Mobility status;Patient requests pain meds PT Visit Diagnosis:  Unsteadiness on feet (R26.81);Muscle weakness (generalized) (M62.81)     Time: 1039-1100 PT Time Calculation (min) (ACUTE ONLY): 21 min  Charges:  $Therapeutic Exercise: 8-22 mins                     Marisa Severin, PT, DPT Acute Rehabilitation Services Pager [phone] Office [phone]       [name] 09/06/2019, 12:31 PM

## 2019-09-06 NOTE — Progress Notes (Signed)
Chest tube to water seal per MD order.  Lavenia Atlas, RN

## 2019-09-06 NOTE — Progress Notes (Signed)
Received order however pt with n/a diagnosis for CRPI. Yves Dill CES, ACSM 10:21 AM 09/06/2019

## 2019-09-07 ENCOUNTER — Inpatient Hospital Stay (HOSPITAL_COMMUNITY): Payer: No Typology Code available for payment source

## 2019-09-07 DIAGNOSIS — Z9689 Presence of other specified functional implants: Secondary | ICD-10-CM

## 2019-09-07 DIAGNOSIS — M7989 Other specified soft tissue disorders: Secondary | ICD-10-CM

## 2019-09-07 LAB — CBC
HCT: 36.9 % — ABNORMAL LOW (ref 39.0–52.0)
Hemoglobin: 11.7 g/dL — ABNORMAL LOW (ref 13.0–17.0)
MCH: 28.9 pg (ref 26.0–34.0)
MCHC: 31.7 g/dL (ref 30.0–36.0)
MCV: 91.1 fL (ref 80.0–100.0)
Platelets: 423 10*3/uL — ABNORMAL HIGH (ref 150–400)
RBC: 4.05 MIL/uL — ABNORMAL LOW (ref 4.22–5.81)
RDW: 13.2 % (ref 11.5–15.5)
WBC: 4.3 10*3/uL (ref 4.0–10.5)
nRBC: 0 % (ref 0.0–0.2)

## 2019-09-07 LAB — BASIC METABOLIC PANEL
Anion gap: 4 — ABNORMAL LOW (ref 5–15)
BUN: 8 mg/dL (ref 8–23)
CO2: 34 mmol/L — ABNORMAL HIGH (ref 22–32)
Calcium: 7.6 mg/dL — ABNORMAL LOW (ref 8.9–10.3)
Chloride: 94 mmol/L — ABNORMAL LOW (ref 98–111)
Creatinine, Ser: 0.41 mg/dL — ABNORMAL LOW (ref 0.61–1.24)
GFR calc Af Amer: >60 mL/min (ref >60)
GFR calc non Af Amer: >60 mL/min (ref >60)
Glucose, Bld: 162 mg/dL — ABNORMAL HIGH (ref 70–99)
Potassium: 4.6 mmol/L (ref 3.5–5.1)
Sodium: 132 mmol/L — ABNORMAL LOW (ref 135–145)

## 2019-09-07 LAB — GLUCOSE, CAPILLARY
Glucose-Capillary: 133 mg/dL — ABNORMAL HIGH (ref 70–99)
Glucose-Capillary: 135 mg/dL — ABNORMAL HIGH (ref 70–99)
Glucose-Capillary: 138 mg/dL — ABNORMAL HIGH (ref 70–99)
Glucose-Capillary: 147 mg/dL — ABNORMAL HIGH (ref 70–99)
Glucose-Capillary: 158 mg/dL — ABNORMAL HIGH (ref 70–99)
Glucose-Capillary: 167 mg/dL — ABNORMAL HIGH (ref 70–99)
Glucose-Capillary: 170 mg/dL — ABNORMAL HIGH (ref 70–99)
Glucose-Capillary: 170 mg/dL — ABNORMAL HIGH (ref 70–99)

## 2019-09-07 MED ORDER — SODIUM PHOSPHATES 45 MMOLE/15ML IV SOLN
20.0000 mmol | Freq: Once | INTRAVENOUS | Status: AC
  Administered 2019-09-07: 20 mmol via INTRAVENOUS
  Filled 2019-09-07: qty 6.67

## 2019-09-07 MED ORDER — SODIUM CHLORIDE 0.9 % IV SOLN
3.0000 g | Freq: Four times a day (QID) | INTRAVENOUS | Status: DC
  Administered 2019-09-07 – 2019-09-09 (×7): 3 g via INTRAVENOUS
  Filled 2019-09-07 (×2): qty 3
  Filled 2019-09-07: qty 8
  Filled 2019-09-07: qty 3
  Filled 2019-09-07: qty 8
  Filled 2019-09-07 (×3): qty 3
  Filled 2019-09-07: qty 8
  Filled 2019-09-07: qty 3
  Filled 2019-09-07: qty 8

## 2019-09-07 NOTE — Progress Notes (Signed)
Patient appears to now have a left small basilar pneumothorax. Will check CXR in am and if stable, will consider removal then.

## 2019-09-07 NOTE — Progress Notes (Signed)
PT Cancellation Note  Patient Details Name: Brandon Miles MRN: [id number] DOB: October 17, 1950   Cancelled Treatment:    Reason Eval/Treat Not Completed: Patient at procedure or test/unavailable. Pt motivated to participate, but limited by arrival of xray to image chest for chest tube removal. Will check back as schedule permits.  Mabeline Caras, PT, DPT Acute Rehabilitation Services  Pager [phone] Office [phone]  Derry Lory 09/07/2019, 2:03 PM

## 2019-09-07 NOTE — Progress Notes (Signed)
Antimicrobial Stewardship Note - Antibiotics and cultures  Brandon Miles was seen at [facility] prior to transferring to [facility].  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  This is mouth flora.  No susceptibilities were done, but this organism is usually susceptible to beta lactam antibiotics.  He has been on vancomycin and meropenem since admission, and cultures here are negative, although the anaerobic culture of the pleural fluid is still pending.  Contacted Dr. Wyline Copas with this information.  Orders received to optimize antibiotics to Unasyn 3g IV q6h.  Heide Guile, PharmD, BCPS-AQ ID Clinical Pharmacist Pager [phone]

## 2019-09-07 NOTE — Progress Notes (Signed)
Left upper extremity venous duplex completed. Refer to "CV Proc" under chart review to view preliminary results.  09/07/2019 10:21 AM Kelby Aline., MHA, RVT, RDCS, RDMS

## 2019-09-07 NOTE — Progress Notes (Signed)
PROGRESS NOTE    Brandon Miles  [id number] DOB: [birth year] DOA: 08/31/2019 PCP: Laurey Morale, MD    Brief Narrative:  69 year old with history of hep C, throat cancer status post radiation, surgery, PEG tube placement presented from [facility] for right-sided empyema.  Had progressive shortness of breath over past few weeks, underwent outpatient right-sided thoracentesis by IR, 300 cc of fluid was removed and sent to [place] showed thickened walled right-sided pleural effusion with compressive atelectasis, right middle and upper lobe groundglass opacities, started on vancomycin and Zosyn.  Chest tube was placed by IR at [facility], PEG tube was exchanged with a Foley catheter and transferred to [facility] for surgical consultation and decortication.  Assessment & Plan:   Principal Problem:   Empyema of right pleural space (HCC) Active Problems:   Uncontrolled type 2 diabetes mellitus with hyperglycemia, without long-term current use of insulin (HCC)   Hyponatremia   Severe protein-calorie malnutrition (HCC)   Empyema, right (HCC)   Right pleural space empyema with compressive atelectasis, middle and upper lobe groundglass opacity -Now on unasyn -CT surgery following and pt now s/p VATS for drainage of empyema with decortication of the right lung on 5/27 -Pt currently remains with chest tube, managed by CTS -CXR today with finding of basilar pneumothorax, thus plan to continue chest tube today per CTS  Insulin-dependent diabetes mellitus type 2, uncontrolled due to hyperglycemia -Hemoglobin A1c 8.0.  New diagnosis -continue Insulin sliding scale and Accu-Chek. -Diabetic coordinator following -Glycemic trends overall stable  Hyponatremia, 128 upon admission -Improved after hydration earlier  Severe protein calorie malnutrition -Nutrition consulted.  Tube feeding via PEG tube-continued per nutrition recs  Depression -On Cymbalta as  tolertaed  Iron deficiency anemia -Iron supplementation -Hemodynamically stable   DVT prophylaxis: Lovenox subq Code Status: Full Family Communication: Pt in room, family not at bedside  Status is: Inpatient  Remains inpatient appropriate because:Ongoing diagnostic testing needed not appropriate for outpatient work up, Unsafe d/c plan and Inpatient level of care appropriate due to severity of illness   Dispo: The patient is from: Home              Anticipated d/c is to: Home              Anticipated d/c date is: 3 days              Patient currently is not medically stable to d/c.        Consultants:   CTS  Procedures:   VATS 5/27  Antimicrobials: Anti-infectives (From admission, onward)   Start     Dose/Rate Route Frequency Ordered Stop   09/07/19 1600  Ampicillin-Sulbactam (UNASYN) 3 g in sodium chloride 0.9 % 100 mL IVPB     3 g 200 mL/hr over 30 Minutes Intravenous Every 6 hours 09/07/19 1344     09/05/19 1600  vancomycin (VANCOREADY) IVPB 1250 mg/250 mL  Status:  Discontinued     1,250 mg 166.7 mL/hr over 90 Minutes Intravenous Every 12 hours 09/05/19 0955 09/07/19 1344   09/02/19 0400  vancomycin (VANCOREADY) IVPB 750 mg/150 mL  Status:  Discontinued     750 mg 150 mL/hr over 60 Minutes Intravenous Every 12 hours 09/01/19 1104 09/05/19 0955   09/01/19 1500  vancomycin (VANCOCIN) IVPB 1000 mg/200 mL premix     1,000 mg 200 mL/hr over 60 Minutes Intravenous On call to O.R. 09/01/19 0927 09/01/19 1556   09/01/19 0400  vancomycin (VANCOREADY) IVPB  750 mg/150 mL  Status:  Discontinued     750 mg 150 mL/hr over 60 Minutes Intravenous Every 12 hours 09/01/19 0307 09/01/19 1104   09/01/19 0330  meropenem (MERREM) 1 g in sodium chloride 0.9 % 100 mL IVPB  Status:  Discontinued     1 g 200 mL/hr over 30 Minutes Intravenous Every 8 hours 09/01/19 0307 09/07/19 1344       Subjective: Without complaints today  Objective: Vitals:   09/07/19 0810 09/07/19 0827  09/07/19 1121 09/07/19 1547  BP: 123/82  131/78 135/87  Pulse: 97 98 (!) 101 (!) 106  Resp: 13 16 15 15   Temp: 98.8 F (37.1 C)  98.1 F (36.7 C) 98.1 F (36.7 C)  TempSrc: Axillary  Oral Oral  SpO2: 94% 93% 93% 95%  Weight:      Height:        Intake/Output Summary (Last 24 hours) at 09/07/2019 1720 Last data filed at 09/07/2019 1601 Gross per 24 hour  Intake 2340.77 ml  Output 4575 ml  Net -2234.23 ml   Filed Weights   09/04/19 0500 09/05/19 0416 09/06/19 0357  Weight: 67 kg 66.1 kg 67.9 kg    Examination:  General exam: Appears calm and comfortable  Respiratory system: Clear to auscultation. Respiratory effort normal. Cardiovascular system: S1 & S2 heard, Regular Gastrointestinal system: Abdomen is nondistended, soft and nontender. No organomegaly or masses felt. Normal bowel sounds heard. Central nervous system: Alert and oriented. No focal neurological deficits. Extremities: Symmetric 5 x 5 power. Skin: No rashes, lesions Psychiatry: Judgement and insight appear normal. Mood & affect appropriate.   Data Reviewed: I have personally reviewed following labs and imaging studies  CBC: Recent Labs  Lab 09/01/19 0017 09/01/19 1607 09/02/19 0306 09/02/19 0306 09/02/19 0532 09/02/19 0931 09/03/19 0332 09/04/19 0422 09/07/19 0606  WBC 10.9*  --  16.1*  --   --   --  15.1* 6.2 4.3  NEUTROABS 9.6*  --   --   --   --   --   --   --   --   HGB 11.9*   < > 12.5*   < > 13.3 11.2* 12.4* 11.4* 11.7*  HCT 36.8*   < > 37.9*   < > 39.0 33.0* 38.3* 36.1* 36.9*  MCV 88.2  --  86.3  --   --   --  89.1 90.7 91.1  PLT 580*  --  583*  --   --   --  628* 366 423*   < > = values in this interval not displayed.   Basic Metabolic Panel: Recent Labs  Lab 09/02/19 0306 09/02/19 0532 09/03/19 [id number] 09/04/19 0422 09/04/19 1345 09/04/19 1725 09/05/19 0430 09/05/19 0957 09/06/19 0435 09/06/19 0436 09/07/19 0606  NA 130*   < > 134* 135  --   --   --  134* 136  --  132*  K 4.1   <  > 3.8 3.6  --   --   --  4.0 3.9  --  4.6  CL 94*   < > 99 100  --   --   --  98 97*  --  94*  CO2 30   < > 29 29  --   --   --  31 34*  --  34*  GLUCOSE 186*   < > 134* 101*  --   --   --  143* 108*  --  162*  BUN 8   < >  8 <5*  --   --   --  7* 7*  --  8  CREATININE 0.48*   < > 0.44* 0.41*  --   --   --  0.46* 0.35*  --  0.41*  CALCIUM 7.9*   < > 7.6* 7.5*  --   --   --  7.3* 7.3*  --  7.6*  MG 1.9  --  1.8 1.8  --   --  2.0  --  1.9  --   --   PHOS 3.1  --   --   --  2.0* 1.7* 1.8*  --   --  2.2*  --    < > = values in this interval not displayed.   GFR: Estimated Creatinine Clearance: 84.9 mL/min (A) (by C-G formula based on SCr of 0.41 mg/dL (L)). Liver Function Tests: Recent Labs  Lab 09/01/19 0017 09/03/19 0332  AST 35 43*  ALT 24 23  ALKPHOS 75 60  BILITOT 0.6 0.3  PROT 6.1* 5.1*  ALBUMIN 1.5* 1.8*   No results for input(s): LIPASE, AMYLASE in the last 168 hours. No results for input(s): AMMONIA in the last 168 hours. Coagulation Profile: Recent Labs  Lab 09/01/19 0017  INR 1.1   Cardiac Enzymes: No results for input(s): CKTOTAL, CKMB, CKMBINDEX, TROPONINI in the last 168 hours. BNP (last 3 results) No results for input(s): PROBNP in the last 8760 hours. HbA1C: No results for input(s): HGBA1C in the last 72 hours. CBG: Recent Labs  Lab 09/07/19 0440 09/07/19 0559 09/07/19 0808 09/07/19 1144 09/07/19 1612  GLUCAP 170* 158* 167* 147* 135*   Lipid Profile: No results for input(s): CHOL, HDL, LDLCALC, TRIG, CHOLHDL, LDLDIRECT in the last 72 hours. Thyroid Function Tests: No results for input(s): TSH, T4TOTAL, FREET4, T3FREE, THYROIDAB in the last 72 hours. Anemia Panel: No results for input(s): VITAMINB12, FOLATE, FERRITIN, TIBC, IRON, RETICCTPCT in the last 72 hours. Sepsis Labs: Recent Labs  Lab 09/01/19 0017  LATICACIDVEN 1.1    Recent Results (from the past 240 hour(s))  Surgical pcr screen     Status: None   Collection Time: 09/01/19  3:03 AM    Specimen: Nasal Mucosa; Nasal Swab  Result Value Ref Range Status   MRSA, PCR NEGATIVE NEGATIVE Final   Staphylococcus aureus NEGATIVE NEGATIVE Final    Comment: (NOTE) The Xpert SA Assay (FDA approved for NASAL specimens in patients 25 years of age and older), is one component of a comprehensive surveillance program. It is not intended to diagnose infection nor to guide or monitor treatment. Performed at [facility], [place] [address], [place], [place] [address]   Body fluid culture     Status: None   Collection Time: 09/01/19  4:15 PM   Specimen: PATH Cytology Pleural fluid; Body Fluid  Result Value Ref Range Status   Specimen Description FLUID PLEURAL  Final   Special Requests NONE  Final   Gram Stain   Final    ABUNDANT WBC PRESENT, PREDOMINANTLY PMN NO ORGANISMS SEEN    Culture   Final    NO GROWTH 3 DAYS Performed at [facility], [address], [place], [place] [address]    Report Status 09/05/2019 FINAL  Final  Fungus Culture With Stain     Status: None (Preliminary result)   Collection Time: 09/01/19  4:15 PM   Specimen: PATH Cytology Pleural fluid; Body Fluid  Result Value Ref Range Status   Fungus Stain Final report  Final    Comment: (NOTE) Performed At: [facility] [place], [place] [id number] Rush Farmer MD [id number]    Fungus (Mycology) Culture PENDING  Incomplete   Fungal Source FLUID  Final    Comment: PLEURAL  Acid Fast Smear (AFB)     Status: None   Collection Time: 09/01/19  4:15 PM   Specimen: PATH Cytology Pleural fluid; Body Fluid  Result Value Ref Range Status   AFB Specimen Processing Concentration  Final   Acid Fast Smear Negative  Final    Comment: (NOTE) Performed At: [facility] [place], [place] [id number] Rush Farmer MD [id number]    Source (AFB) FLUID  Final    Comment: PLEURAL  Anaerobic culture     Status: None (Preliminary result)   Collection Time:  09/01/19  4:15 PM   Specimen: Fluid  Result Value Ref Range Status   Specimen Description FLUID PLEURAL  Final   Special Requests NONE  Final   Culture   Final    HOLDING FOR POSSIBLE ANAEROBE Performed at [facility], [address], [place], [place] [address]    Report Status PENDING  Incomplete  Fungus Culture Result     Status: None   Collection Time: 09/01/19  4:15 PM  Result Value Ref Range Status   Result 1 Comment  Final    Comment: (NOTE) KOH/Calcofluor preparation:  no fungus observed. Performed At: [facility] [place], [place] [id number] Rush Farmer MD [id number]   SARS Coronavirus 2 by RT PCR (hospital order, performed in [facility] hospital lab) Nasopharyngeal Nasopharyngeal Swab     Status: None   Collection Time: 09/01/19  5:23 PM   Specimen: Nasopharyngeal Swab  Result Value Ref Range Status   SARS Coronavirus 2 NEGATIVE NEGATIVE Final    Comment: (NOTE) SARS-CoV-2 target nucleic acids are NOT DETECTED. The SARS-CoV-2 RNA is generally detectable in upper and lower respiratory specimens during the acute phase of infection. The lowest concentration of SARS-CoV-2 viral copies this assay can detect is 250 copies / mL. A negative result does not preclude SARS-CoV-2 infection and should not be used as the sole basis for treatment or other patient management decisions.  A negative result may occur with improper specimen collection / handling, submission of specimen other than nasopharyngeal swab, presence of viral mutation(s) within the areas targeted by this assay, and inadequate number of viral copies (<250 copies / mL). A negative result must be combined with clinical observations, patient history, and epidemiological information. Fact Sheet for Patients:   StrictlyIdeas.no Fact Sheet for Healthcare Providers: BankingDealers.co.za This test is not yet approved or cleared  by the  [place] FDA and has been authorized for detection and/or diagnosis of SARS-CoV-2 by FDA under an Emergency Use Authorization (EUA).  This EUA will remain in effect (meaning this test can be used) for the duration of the COVID-19 declaration under Section 564(b)(1) of the Act, 21 U.S.C. section 360bbb-3(b)(1), unless the authorization is terminated or revoked sooner. Performed at [facility], [place] [address], [place], [place] [address]      Radiology Studies: DG Chest Port 1 View  Result Date: 09/07/2019 CLINICAL DATA:  Chest tube EXAM: PORTABLE CHEST 1 VIEW COMPARISON:  Earlier same day FINDINGS: Similar right hydropneumothorax. Right basilar atelectasis/consolidation is unchanged. Improved lung aeration at the left base. Stable cardiomediastinal contours. IMPRESSION: Similar right hydropneumothorax with chest tube present. Persistent right basilar atelectasis/consolidation. Improved aeration at the  left lung base. Electronically Signed   By: Macy Mis M.D.   On: 09/07/2019 16:00   DG CHEST PORT 1 VIEW  Result Date: 09/07/2019 CLINICAL DATA:  Pneumothorax follow-up EXAM: PORTABLE CHEST 1 VIEW COMPARISON:  Yesterday FINDINGS: Left subclavian line with tip near the SVC origin. Right-sided chest tube in stable position. No convincing pneumothorax. Stable pleural fluid/thickening at the right base and streaky atelectatic type opacity. New indistinct left lower lobe opacity. Stable heart size and mediastinal contours. IMPRESSION: 1. Essentially stable postoperative right chest. No definite pneumothorax. 2. New left lower lobe opacity. Electronically Signed   By: Monte Fantasia M.D.   On: 09/07/2019 10:42   DG CHEST PORT 1 VIEW  Result Date: 09/06/2019 CLINICAL DATA:  Congestion.  Cough. EXAM: PORTABLE CHEST 1 VIEW COMPARISON:  09/05/2019. FINDINGS: Left central line, right chest tube in stable position. Stable mild right hydropneumothorax. Stable atelectatic changes/infiltrate right  lung base. No other focal abnormality identified. IMPRESSION: Left central line, right chest tube in stable position. Stable mild right hydropneumothorax. Stable atelectatic changes/infiltrate right lung base. Electronically Signed   By: Marcello Moores  Register   On: 09/06/2019 08:35   VAS [place] UPPER EXTREMITY VENOUS DUPLEX  Result Date: 09/07/2019 UPPER VENOUS STUDY  Indications: Swelling Comparison Study: No prior study Performing Technologist: Maudry Mayhew MHA, RDMS, RVT, RDCS  Examination Guidelines: A complete evaluation includes B-mode imaging, spectral Doppler, color Doppler, and power Doppler as needed of all accessible portions of each vessel. Bilateral testing is considered an integral part of a complete examination. Limited examinations for reoccurring indications may be performed as noted.  Right Findings: +----------+------------+---------+-----------+----------+-------+ RIGHT     CompressiblePhasicitySpontaneousPropertiesSummary +----------+------------+---------+-----------+----------+-------+ Subclavian               Yes       Yes                      +----------+------------+---------+-----------+----------+-------+  Left Findings: +----------+------------+---------+-----------+----------+-------+ LEFT      CompressiblePhasicitySpontaneousPropertiesSummary +----------+------------+---------+-----------+----------+-------+ IJV           Full       Yes       Yes                      +----------+------------+---------+-----------+----------+-------+ Subclavian    Full       Yes       Yes                      +----------+------------+---------+-----------+----------+-------+ Axillary      Full       Yes       Yes                      +----------+------------+---------+-----------+----------+-------+ Brachial      Full       Yes       Yes                      +----------+------------+---------+-----------+----------+-------+ Radial        Full                                           +----------+------------+---------+-----------+----------+-------+ Ulnar         Full                                          +----------+------------+---------+-----------+----------+-------+  Cephalic      None                                   Acute  +----------+------------+---------+-----------+----------+-------+  Summary:  Right: No evidence of thrombosis in the subclavian.  Left: No evidence of deep vein thrombosis in the upper extremity. No evidence of superficial vein thrombosis in the upper extremity.  *See table(s) above for measurements and observations.    Preliminary     Scheduled Meds: . chlorhexidine  15 mL Mouth Rinse BID  . Chlorhexidine Gluconate Cloth  6 each Topical Daily  . enoxaparin (LOVENOX) injection  30 mg Subcutaneous Q24H  . feeding supplement (PRO-STAT SUGAR FREE 64)  30 mL Per Tube BID  . ferrous sulfate  300 mg Per Tube Q breakfast  . guaiFENesin-dextromethorphan  10 mL Per Tube Q6H  . insulin aspart  0-24 Units Subcutaneous Q6H  . mouth rinse  15 mL Mouth Rinse q12n4p  . metoCLOPramide (REGLAN) injection  5 mg Intravenous Q6H  . multivitamin with minerals  1 tablet Per Tube Daily  . senna-docusate  1 tablet Per Tube QHS  . sodium chloride flush  10-40 mL Intracatheter Q12H  . sodium chloride flush  3 mL Intravenous Q12H  . traZODone  25 mg Per Tube QHS   Continuous Infusions: . sodium chloride 10 mL/hr at 09/02/19 1900  . sodium chloride    . 0.9 % NaCl with KCl 20 mEq / L 50 mL/hr at 09/06/19 0741  . ampicillin-sulbactam (UNASYN) IV 3 g (09/07/19 1601)  . feeding supplement (OSMOLITE 1.5 CAL) 1,000 mL (09/05/19 2155)     LOS: 7 days   Marylu Lund, MD [facility] Pager On Amion  If 7PM-7AM, please contact night-coverage 09/07/2019, 5:20 PM

## 2019-09-07 NOTE — Progress Notes (Addendum)
      [id number]       [place] [id number]             [phone]       6 Days Post-Op Procedure(s) (LRB): VIDEO ASSISTED THORACOSCOPY (VATS)/EMPYEMA  AND DECORTICATION (Right) Insertion Central Line Adult (Left)  Subjective: Patient has some pain at chest tube site  Objective: Vital signs in last 24 hours: Temp:  [97.7 F (36.5 C)-98.9 F (37.2 C)] 98.2 F (36.8 C) (06/02 0440) Pulse Rate:  [90-106] 96 (06/02 0440) Cardiac Rhythm: Normal sinus rhythm (06/01 2048) Resp:  [11-16] 11 (06/02 0440) BP: (107-142)/(66-82) 127/79 (06/02 0440) SpO2:  [94 %-96 %] 95 % (06/02 0440)     Intake/Output from previous day: 06/01 0701 - 06/02 0700 In: 4376.8 [I.V.:605; IV Piggyback:456.8] Out: 2530 [Urine:2450; Chest Tube:80]   Physical Exam:  Cardiovascular: RRR Pulmonary: Rhonchi this am Abdomen: Soft, non tender, bowel sounds present, Gastrostomy tube in place Extremities: No lower extremity edema. Wounds: Clean and dry.  No erythema or signs of infection. Chest Tube: to water seal, no air leak  Lab Results: CBC: Recent Labs    09/07/19 0606  WBC 4.3  HGB 11.7*  HCT 36.9*  PLT 423*   BMET:  Recent Labs    09/06/19 0435 09/07/19 0606  NA 136 132*  K 3.9 4.6  CL 97* 94*  CO2 34* 34*  GLUCOSE 108* 162*  BUN 7* 8  CREATININE 0.35* 0.41*  CALCIUM 7.3* 7.6*    PT/INR: No results for input(s): LABPROT, INR in the last 72 hours. ABG:  INR: Will add last result for INR, ABG once components are confirmed Will add last 4 CBG results once components are confirmed  Assessment/Plan:  1. CV - Slightly tachycardic with HR in the low 100's.  2.  Pulmonary - On room air. Chest tube with 130 cc (50 cc last 12 hours) last 24 hours. Chest tube is to water seal and there is no air leak.  If CXR stable, remove chest tube. Encourage incentive spirometer. 3. ID-on Vancomycin and Meropenem for empyema 4. CBGs 133/170/158. Pre op HGA1C 8. On Insulin. He will  need to follow up with his medical doctor after discharge 5. Anemia-Last H and H 11.7 and 36.On Ferrous sulfate 6. GI-NPO, TFs at 70 ml/hr 7. Severely deconditioned-PT 8. Swelling left arm-duplex (to rule out DVT) still NOT done  Donielle M ZimmermanPA-C 09/07/2019,7:02 AM [id number]  Duplex scan shows phlebitis in the left cephalic vein probably from infiltrated IV, no DVT  After clamping chest tube for 4 4 hours today follow-up chest x-ray showed slight increase in basilar pneumothorax of chest tube was left into waterseal.  patient examined and medical record reviewed,agree with above note. Tharon Aquas Trigt III 09/07/2019

## 2019-09-07 NOTE — Plan of Care (Signed)
  Problem: Health Behavior/Discharge Planning: Goal: Ability to manage health-related needs will improve Outcome: Not Progressing   Problem: Clinical Measurements: Goal: Respiratory complications will improve Outcome: Not Progressing   

## 2019-09-07 NOTE — Progress Notes (Signed)
Inpatient Diabetes Program Recommendations  AACE/ADA: New Consensus Statement on Inpatient Glycemic Control   Target Ranges:  Prepandial:   less than 140 mg/dL      Peak postprandial:   less than 180 mg/dL (1-2 hours)      Critically ill patients:  140 - 180 mg/dL   Results for Brandon Miles, Brandon Miles (MRN [id number]) as of 09/07/2019 12:51  Ref. Range 09/07/2019 00:01 09/07/2019 04:40 09/07/2019 05:59 09/07/2019 08:08 09/07/2019 11:44  Glucose-Capillary Latest Ref Range: 70 - 99 mg/dL 133 (H) 170 (H) 158 (H) 167 (H) 147 (H)  Results for Brandon Miles, Brandon Miles (MRN [id number]) as of 09/07/2019 12:51  Ref. Range 09/01/2019 00:17  Hemoglobin A1C Latest Ref Range: 4.8 - 5.6 % 8.0 (H)   Review of Glycemic Control  Diabetes history: No Outpatient Diabetes medications: NA Current orders for Inpatient glycemic control: Novolog 0-24 units Q6H; Osmolite @ 70 ml/hr  NOTE: Went by to speak with patient regarding new DM dx. RN in room at the time. When asked patient if I could speak with him about new DM dx today, he shook his head side to side to indicate 'no'. Informed patient I would come back again tomorrow to talk with him. Will attempt to speak with him again tomorrow regarding new DM dx.  Thanks, Barnie Alderman, RN, MSN, CDE Diabetes Coordinator Inpatient Diabetes Program [phone] (Team Pager from 8am to 5pm)

## 2019-09-07 NOTE — Progress Notes (Signed)
Physical Therapy Treatment Patient Details Name: Brandon Miles MRN: [id number] DOB: [birth year] Today's Date: 09/07/2019    History of Present Illness Pt transferred from [place] with rt empyema and underwent VATS on 5/27. BUE neg DVT 09/07/19, CXR left small basilar PTX 09/07/19.  PMH - throat CA, PEG, hep C    PT Comments    Pt able to complete all mobility supervision was assistance only for line management. Pt able to amb with RW 625ft with supervision and assistance for line management. Will continue to follow acutely until d/c to next level of care.    Follow Up Recommendations  Home health PT     Equipment Recommendations  Other (comment)(possibly rollator)    Recommendations for Other Services       Precautions / Restrictions Precautions Precautions: Fall Precaution Comments: PEG tube, CT Restrictions Weight Bearing Restrictions: No    Mobility  Bed Mobility Overal bed mobility: Needs Assistance Bed Mobility: Supine to Sit;Sit to Supine     Supine to sit: Supervision;HOB elevated Sit to supine: Supervision;HOB elevated   General bed mobility comments: supervision for assist for lines/tubes. pt required no assistance to completed bed mobility.  Transfers Overall transfer level: Needs assistance Equipment used: Rolling walker (2 wheeled) Transfers: Sit to/from Stand Sit to Stand: Supervision         General transfer comment: supervision for safety, management of lines  Ambulation/Gait Ambulation/Gait assistance: Supervision Gait Distance (Feet): 600 Feet Assistive device: Rolling walker (2 wheeled) Gait Pattern/deviations: Step-through pattern;Decreased stride length     General Gait Details: Slow, steady gait with RW, with assistance only to manage lines   Stairs             Wheelchair Mobility    Modified Rankin (Stroke Patients Only)       Balance Overall balance assessment: Mild deficits observed, not formally tested                                          Cognition Arousal/Alertness: Awake/alert Behavior During Therapy: WFL for tasks assessed/performed Overall Cognitive Status: Within Functional Limits for tasks assessed                                 General Comments: Pt required coaxing to complete therapy today, pt appeared frusterated while situating lines, pt demonstrated difficulty with communication due to [place].      Exercises      General Comments General comments (skin integrity, edema, etc.): VSS on RA. Pt appeared frusterated during session due to wanting to get up right away and not wanting to wait for management of lines for safety. Once back in the room, pt appeared less frusterated and room was set up to his liking.      Pertinent Vitals/Pain Pain Assessment: Faces Faces Pain Scale: Hurts even more Pain Location: rt chest tube site with mobility Pain Descriptors / Indicators: Grimacing;Guarding Pain Intervention(s): Limited activity within patient's tolerance;Monitored during session    Home Living                      Prior Function            PT Goals (current goals can now be found in the care plan section) Acute Rehab PT Goals Patient Stated Goal: not stated  Frequency    Min 3X/week      PT Plan Current plan remains appropriate    Co-evaluation              AM-PAC PT "6 Clicks" Mobility   Outcome Measure  Help needed turning from your back to your side while in a flat bed without using bedrails?: None Help needed moving from lying on your back to sitting on the side of a flat bed without using bedrails?: None Help needed moving to and from a bed to a chair (including a wheelchair)?: None Help needed standing up from a chair using your arms (e.g., wheelchair or bedside chair)?: None Help needed to walk in hospital room?: None Help needed climbing 3-5 steps with a railing? : A Little 6 Click Score: 23    End of  Session   Activity Tolerance: Patient tolerated treatment well Patient left: in bed;with call bell/phone within reach Nurse Communication: Mobility status PT Visit Diagnosis: Unsteadiness on feet (R26.81);Muscle weakness (generalized) (M62.81)     Time: [id number] PT Time Calculation (min) (ACUTE ONLY): 30 min  Charges:  $Gait Training: 8-22 mins(rest of time spent managing lines)                     Rolland Porter SPT 09/07/2019    Rolland Porter 09/07/2019, 3:54 PM

## 2019-09-08 ENCOUNTER — Inpatient Hospital Stay (HOSPITAL_COMMUNITY): Payer: No Typology Code available for payment source

## 2019-09-08 LAB — BASIC METABOLIC PANEL
Anion gap: 7 (ref 5–15)
BUN: 9 mg/dL (ref 8–23)
CO2: 33 mmol/L — ABNORMAL HIGH (ref 22–32)
Calcium: 7.9 mg/dL — ABNORMAL LOW (ref 8.9–10.3)
Chloride: 93 mmol/L — ABNORMAL LOW (ref 98–111)
Creatinine, Ser: 0.38 mg/dL — ABNORMAL LOW (ref 0.61–1.24)
GFR calc Af Amer: >60 mL/min (ref >60)
GFR calc non Af Amer: >60 mL/min (ref >60)
Glucose, Bld: 142 mg/dL — ABNORMAL HIGH (ref 70–99)
Potassium: 4.4 mmol/L (ref 3.5–5.1)
Sodium: 133 mmol/L — ABNORMAL LOW (ref 135–145)

## 2019-09-08 LAB — CBC WITH DIFFERENTIAL/PLATELET
Abs Immature Granulocytes: 0.05 10*3/uL (ref 0.00–0.07)
Basophils Absolute: 0 10*3/uL (ref 0.0–0.1)
Basophils Relative: 1 %
Eosinophils Absolute: 0.1 10*3/uL (ref 0.0–0.5)
Eosinophils Relative: 1 %
HCT: 32.6 % — ABNORMAL LOW (ref 39.0–52.0)
Hemoglobin: 10.4 g/dL — ABNORMAL LOW (ref 13.0–17.0)
Immature Granulocytes: 1 %
Lymphocytes Relative: 14 %
Lymphs Abs: 0.8 10*3/uL (ref 0.7–4.0)
MCH: 29 pg (ref 26.0–34.0)
MCHC: 31.9 g/dL (ref 30.0–36.0)
MCV: 90.8 fL (ref 80.0–100.0)
Monocytes Absolute: 0.7 10*3/uL (ref 0.1–1.0)
Monocytes Relative: 13 %
Neutro Abs: 4.2 10*3/uL (ref 1.7–7.7)
Neutrophils Relative %: 70 %
Platelets: 386 10*3/uL (ref 150–400)
RBC: 3.59 MIL/uL — ABNORMAL LOW (ref 4.22–5.81)
RDW: 13.6 % (ref 11.5–15.5)
WBC: 5.9 10*3/uL (ref 4.0–10.5)
nRBC: 0 % (ref 0.0–0.2)

## 2019-09-08 LAB — GLUCOSE, CAPILLARY
Glucose-Capillary: 118 mg/dL — ABNORMAL HIGH (ref 70–99)
Glucose-Capillary: 135 mg/dL — ABNORMAL HIGH (ref 70–99)
Glucose-Capillary: 152 mg/dL — ABNORMAL HIGH (ref 70–99)
Glucose-Capillary: 153 mg/dL — ABNORMAL HIGH (ref 70–99)
Glucose-Capillary: 76 mg/dL (ref 70–99)

## 2019-09-08 MED ORDER — SODIUM CHLORIDE 0.9 % IV BOLUS
500.0000 mL | Freq: Once | INTRAVENOUS | Status: AC
  Administered 2019-09-08: 500 mL via INTRAVENOUS

## 2019-09-08 MED ORDER — METOPROLOL TARTRATE 5 MG/5ML IV SOLN
5.0000 mg | INTRAVENOUS | Status: DC | PRN
  Administered 2019-09-08: 5 mg via INTRAVENOUS
  Filled 2019-09-08: qty 5

## 2019-09-08 MED ORDER — OSMOLITE 1.5 CAL PO LIQD
474.0000 mL | Freq: Four times a day (QID) | ORAL | Status: DC
  Administered 2019-09-08 – 2019-09-09 (×3): 474 mL
  Filled 2019-09-08 (×7): qty 474

## 2019-09-08 MED ORDER — ENOXAPARIN SODIUM 40 MG/0.4ML ~~LOC~~ SOLN
40.0000 mg | SUBCUTANEOUS | Status: DC
  Administered 2019-09-08: 40 mg via SUBCUTANEOUS
  Filled 2019-09-08: qty 0.4

## 2019-09-08 NOTE — Progress Notes (Signed)
PROGRESS NOTE    DENTRELL COHEE  [id number] DOB: 03/22/1951 DOA: 08/31/2019 PCP: Laurey Morale, MD    Brief Narrative:  69 year old with history of hep C, throat cancer status post radiation, surgery, PEG tube placement presented from [facility] for right-sided empyema.  Had progressive shortness of breath over past few weeks, underwent outpatient right-sided thoracentesis by IR, 300 cc of fluid was removed and sent to [place] showed thickened walled right-sided pleural effusion with compressive atelectasis, right middle and upper lobe groundglass opacities, started on vancomycin and Zosyn.  Chest tube was placed by IR at [facility], PEG tube was exchanged with a Foley catheter and transferred to [facility] for surgical consultation and decortication.  Assessment & Plan:   Principal Problem:   Empyema of right pleural space (HCC) Active Problems:   Uncontrolled type 2 diabetes mellitus with hyperglycemia, without long-term current use of insulin (HCC)   Hyponatremia   Severe protein-calorie malnutrition (HCC)   Empyema, right (HCC)   Right pleural space empyema with compressive atelectasis, middle and upper lobe groundglass opacity -Now on unasyn -CT surgery following and pt now s/p VATS for drainage of empyema with decortication of the right lung on 5/27 -Pt currently remains with chest tube, managed by CTS -Chest tube removed today per CTS  Insulin-dependent diabetes mellitus type 2, uncontrolled due to hyperglycemia -Hemoglobin A1c 8.0.  New diagnosis -continued on Insulin sliding scale and Accu-Chek. -Diabetic coordinator following, to educate pt on diabetes hx -Glycemic trends overall stable thus far  Hyponatremia, 128 upon admission -Improved after hydration earlier  Severe protein calorie malnutrition -Nutrition consulted.  Tube feeding via PEG tube-continued per nutrition recs  Depression -Continued on Cymbalta as tolertaed  Iron  deficiency anemia -Iron supplementation -Hemodynamically stable   DVT prophylaxis: Lovenox subq Code Status: Full Family Communication: Pt in room, family not at bedside  Status is: Inpatient  Remains inpatient appropriate because:Ongoing diagnostic testing needed not appropriate for outpatient work up, Unsafe d/c plan and Inpatient level of care appropriate due to severity of illness   Dispo: The patient is from: Home              Anticipated d/c is to: Home              Anticipated d/c date is: 1 day              Patient currently is not medically stable to d/c.        Consultants:   CTS  Procedures:   VATS 5/27  Antimicrobials: Anti-infectives (From admission, onward)   Start     Dose/Rate Route Frequency Ordered Stop   09/07/19 1600  Ampicillin-Sulbactam (UNASYN) 3 g in sodium chloride 0.9 % 100 mL IVPB     3 g 200 mL/hr over 30 Minutes Intravenous Every 6 hours 09/07/19 1344     09/05/19 1600  vancomycin (VANCOREADY) IVPB 1250 mg/250 mL  Status:  Discontinued     1,250 mg 166.7 mL/hr over 90 Minutes Intravenous Every 12 hours 09/05/19 0955 09/07/19 1344   09/02/19 0400  vancomycin (VANCOREADY) IVPB 750 mg/150 mL  Status:  Discontinued     750 mg 150 mL/hr over 60 Minutes Intravenous Every 12 hours 09/01/19 1104 09/05/19 0955   09/01/19 1500  vancomycin (VANCOCIN) IVPB 1000 mg/200 mL premix     1,000 mg 200 mL/hr over 60 Minutes Intravenous On call to O.R. 09/01/19 0927 09/01/19 1556   09/01/19 0400  vancomycin (VANCOREADY) IVPB  750 mg/150 mL  Status:  Discontinued     750 mg 150 mL/hr over 60 Minutes Intravenous Every 12 hours 09/01/19 0307 09/01/19 1104   09/01/19 0330  meropenem (MERREM) 1 g in sodium chloride 0.9 % 100 mL IVPB  Status:  Discontinued     1 g 200 mL/hr over 30 Minutes Intravenous Every 8 hours 09/01/19 0307 09/07/19 1344      Subjective: No complaints today  Objective: Vitals:   09/07/19 2337 09/08/19 0439 09/08/19 0744 09/08/19  1129  BP: 125/75 137/88 110/78 124/80  Pulse: 100 100 92 98  Resp: 11 13 12 12   Temp: 97.9 F (36.6 C) 98 F (36.7 C) (!) 97 F (36.1 C) 98 F (36.7 C)  TempSrc: Axillary Axillary Oral Axillary  SpO2: 93% 96% 96% 98%  Weight:  63.6 kg    Height:        Intake/Output Summary (Last 24 hours) at 09/08/2019 1427 Last data filed at 09/08/2019 1254 Gross per 24 hour  Intake 2812.57 ml  Output 5735 ml  Net -2922.43 ml   Filed Weights   09/05/19 0416 09/06/19 0357 09/08/19 0439  Weight: 66.1 kg 67.9 kg 63.6 kg    Examination: General exam: Awake, laying in bed, in nad Respiratory system: Normal respiratory effort, no wheezing Cardiovascular system: regular rate, s1, s2 Gastrointestinal system: Soft, nondistended, positive BS Central nervous system: CN2-12 grossly intact, strength intact Extremities: Perfused, no clubbing Skin: Normal skin turgor, no notable skin lesions seen Psychiatry: Mood normal // no visual hallucinations   Data Reviewed: I have personally reviewed following labs and imaging studies  CBC: Recent Labs  Lab 09/02/19 0306 09/02/19 0306 09/02/19 0532 09/02/19 0931 09/03/19 0332 09/04/19 0422 09/07/19 0606  WBC 16.1*  --   --   --  15.1* 6.2 4.3  HGB 12.5*   < > 13.3 11.2* 12.4* 11.4* 11.7*  HCT 37.9*   < > 39.0 33.0* 38.3* 36.1* 36.9*  MCV 86.3  --   --   --  89.1 90.7 91.1  PLT 583*  --   --   --  628* 366 423*   < > = values in this interval not displayed.   Basic Metabolic Panel: Recent Labs  Lab 09/02/19 0306 09/02/19 0532 09/03/19 [id number] 09/03/19 [id number] 09/04/19 0422 09/04/19 1345 09/04/19 1725 09/05/19 0430 09/05/19 0957 09/06/19 0435 09/06/19 0436 09/07/19 0606 09/08/19 0600  NA 130*   < > 134*   < > 135  --   --   --  134* 136  --  132* 133*  K 4.1   < > 3.8   < > 3.6  --   --   --  4.0 3.9  --  4.6 4.4  CL 94*   < > 99   < > 100  --   --   --  98 97*  --  94* 93*  CO2 30   < > 29   < > 29  --   --   --  31 34*  --  34* 33*  GLUCOSE  186*   < > 134*   < > 101*  --   --   --  143* 108*  --  162* 142*  BUN 8   < > 8   < > <5*  --   --   --  7* 7*  --  8 9  CREATININE 0.48*   < > 0.44*   < > 0.41*  --   --   --  0.46* 0.35*  --  0.41* 0.38*  CALCIUM 7.9*   < > 7.6*   < > 7.5*  --   --   --  7.3* 7.3*  --  7.6* 7.9*  MG 1.9  --  1.8  --  1.8  --   --  2.0  --  1.9  --   --   --   PHOS 3.1  --   --   --   --  2.0* 1.7* 1.8*  --   --  2.2*  --   --    < > = values in this interval not displayed.   GFR: Estimated Creatinine Clearance: 79.5 mL/min (A) (by C-G formula based on SCr of 0.38 mg/dL (L)). Liver Function Tests: Recent Labs  Lab 09/03/19 0332  AST 43*  ALT 23  ALKPHOS 60  BILITOT 0.3  PROT 5.1*  ALBUMIN 1.8*   No results for input(s): LIPASE, AMYLASE in the last 168 hours. No results for input(s): AMMONIA in the last 168 hours. Coagulation Profile: No results for input(s): INR, PROTIME in the last 168 hours. Cardiac Enzymes: No results for input(s): CKTOTAL, CKMB, CKMBINDEX, TROPONINI in the last 168 hours. BNP (last 3 results) No results for input(s): PROBNP in the last 8760 hours. HbA1C: No results for input(s): HGBA1C in the last 72 hours. CBG: Recent Labs  Lab 09/07/19 2012 09/07/19 2340 09/08/19 0437 09/08/19 0801 09/08/19 1226  GLUCAP 138* 170* 152* 135* 153*   Lipid Profile: No results for input(s): CHOL, HDL, LDLCALC, TRIG, CHOLHDL, LDLDIRECT in the last 72 hours. Thyroid Function Tests: No results for input(s): TSH, T4TOTAL, FREET4, T3FREE, THYROIDAB in the last 72 hours. Anemia Panel: No results for input(s): VITAMINB12, FOLATE, FERRITIN, TIBC, IRON, RETICCTPCT in the last 72 hours. Sepsis Labs: No results for input(s): PROCALCITON, LATICACIDVEN in the last 168 hours.  Recent Results (from the past 240 hour(s))  Surgical pcr screen     Status: None   Collection Time: 09/01/19  3:03 AM   Specimen: Nasal Mucosa; Nasal Swab  Result Value Ref Range Status   MRSA, PCR NEGATIVE NEGATIVE  Final   Staphylococcus aureus NEGATIVE NEGATIVE Final    Comment: (NOTE) The Xpert SA Assay (FDA approved for NASAL specimens in patients 77 years of age and older), is one component of a comprehensive surveillance program. It is not intended to diagnose infection nor to guide or monitor treatment. Performed at [facility], [place] [address] [place], [place] [address]   Body fluid culture     Status: None   Collection Time: 09/01/19  4:15 PM   Specimen: PATH Cytology Pleural fluid; Body Fluid  Result Value Ref Range Status   Specimen Description FLUID PLEURAL  Final   Special Requests NONE  Final   Gram Stain   Final    ABUNDANT WBC PRESENT, PREDOMINANTLY PMN NO ORGANISMS SEEN    Culture   Final    NO GROWTH 3 DAYS Performed at [facility], [address], [place],  [address]    Report Status 09/05/2019 FINAL  Final  Fungus Culture With Stain     Status: None (Preliminary result)   Collection Time: 09/01/19  4:15 PM   Specimen: PATH Cytology Pleural fluid; Body Fluid  Result Value Ref Range Status   Fungus Stain Final report  Final    Comment: (NOTE) Performed At: [facility] [place], [place] [id number] Rush Farmer MD [id number]    Fungus (  Mycology) Culture PENDING  Incomplete   Fungal Source FLUID  Final    Comment: PLEURAL  Acid Fast Smear (AFB)     Status: None   Collection Time: 09/01/19  4:15 PM   Specimen: PATH Cytology Pleural fluid; Body Fluid  Result Value Ref Range Status   AFB Specimen Processing Concentration  Final   Acid Fast Smear Negative  Final    Comment: (NOTE) Performed At: [facility] [place], [place] [id number] Rush Farmer MD [id number]    Source (AFB) FLUID  Final    Comment: PLEURAL  Anaerobic culture     Status: None (Preliminary result)   Collection Time: 09/01/19  4:15 PM   Specimen: Fluid  Result Value Ref Range Status   Specimen Description FLUID PLEURAL   Final   Special Requests NONE  Final   Culture   Final    HOLDING FOR POSSIBLE ANAEROBE Performed at [facility], [address] [place], [place] [address]    Report Status PENDING  Incomplete  Fungus Culture Result     Status: None   Collection Time: 09/01/19  4:15 PM  Result Value Ref Range Status   Result 1 Comment  Final    Comment: (NOTE) KOH/Calcofluor preparation:  no fungus observed. Performed At: [facility] [place], [place] [id number] Rush Farmer MD [id number]   SARS Coronavirus 2 by RT PCR (hospital order, performed in [facility] hospital lab) Nasopharyngeal Nasopharyngeal Swab     Status: None   Collection Time: 09/01/19  5:23 PM   Specimen: Nasopharyngeal Swab  Result Value Ref Range Status   SARS Coronavirus 2 NEGATIVE NEGATIVE Final    Comment: (NOTE) SARS-CoV-2 target nucleic acids are NOT DETECTED. The SARS-CoV-2 RNA is generally detectable in upper and lower respiratory specimens during the acute phase of infection. The lowest concentration of SARS-CoV-2 viral copies this assay can detect is 250 copies / mL. A negative result does not preclude SARS-CoV-2 infection and should not be used as the sole basis for treatment or other patient management decisions.  A negative result may occur with improper specimen collection / handling, submission of specimen other than nasopharyngeal swab, presence of viral mutation(s) within the areas targeted by this assay, and inadequate number of viral copies (<250 copies / mL). A negative result must be combined with clinical observations, patient history, and epidemiological information. Fact Sheet for Patients:   StrictlyIdeas.no Fact Sheet for Healthcare Providers: BankingDealers.co.za This test is not yet approved or cleared  by the [place] FDA and has been authorized for detection and/or diagnosis of SARS-CoV-2 by FDA under an  Emergency Use Authorization (EUA).  This EUA will remain in effect (meaning this test can be used) for the duration of the COVID-19 declaration under Section 564(b)(1) of the Act, 21 U.S.C. section 360bbb-3(b)(1), unless the authorization is terminated or revoked sooner. Performed at [facility], [place] [address] [place], [place] [address]      Radiology Studies: DG CHEST PORT 1 VIEW  Result Date: 09/08/2019 CLINICAL DATA:  Pneumothorax and chest tube EXAM: PORTABLE CHEST 1 VIEW COMPARISON:  Yesterday FINDINGS: Right-sided chest tube in place. Left subclavian line with tip near the SVC origin. Unchanged pleural fluid/thickening along the lateral right chest wall small gas component. The left lung is clear. Normal heart size. IMPRESSION: Unchanged postoperative right chest. Electronically Signed   By: Monte Fantasia M.D.   On: 09/08/2019 08:17   DG Chest Port 1  View  Result Date: 09/07/2019 CLINICAL DATA:  Chest tube EXAM: PORTABLE CHEST 1 VIEW COMPARISON:  Earlier same day FINDINGS: Similar right hydropneumothorax. Right basilar atelectasis/consolidation is unchanged. Improved lung aeration at the left base. Stable cardiomediastinal contours. IMPRESSION: Similar right hydropneumothorax with chest tube present. Persistent right basilar atelectasis/consolidation. Improved aeration at the left lung base. Electronically Signed   By: Macy Mis M.D.   On: 09/07/2019 16:00   DG CHEST PORT 1 VIEW  Result Date: 09/07/2019 CLINICAL DATA:  Pneumothorax follow-up EXAM: PORTABLE CHEST 1 VIEW COMPARISON:  Yesterday FINDINGS: Left subclavian line with tip near the SVC origin. Right-sided chest tube in stable position. No convincing pneumothorax. Stable pleural fluid/thickening at the right base and streaky atelectatic type opacity. New indistinct left lower lobe opacity. Stable heart size and mediastinal contours. IMPRESSION: 1. Essentially stable postoperative right chest. No definite pneumothorax.  2. New left lower lobe opacity. Electronically Signed   By: Monte Fantasia M.D.   On: 09/07/2019 10:42   VAS [place] UPPER EXTREMITY VENOUS DUPLEX  Result Date: 09/07/2019 UPPER VENOUS STUDY  Indications: Swelling Comparison Study: No prior study Performing Technologist: Maudry Mayhew MHA, RDMS, RVT, RDCS  Examination Guidelines: A complete evaluation includes B-mode imaging, spectral Doppler, color Doppler, and power Doppler as needed of all accessible portions of each vessel. Bilateral testing is considered an integral part of a complete examination. Limited examinations for reoccurring indications may be performed as noted.  Right Findings: +----------+------------+---------+-----------+----------+-------+ RIGHT     CompressiblePhasicitySpontaneousPropertiesSummary +----------+------------+---------+-----------+----------+-------+ Subclavian               Yes       Yes                      +----------+------------+---------+-----------+----------+-------+  Left Findings: +----------+------------+---------+-----------+----------+-------+ LEFT      CompressiblePhasicitySpontaneousPropertiesSummary +----------+------------+---------+-----------+----------+-------+ IJV           Full       Yes       Yes                      +----------+------------+---------+-----------+----------+-------+ Subclavian    Full       Yes       Yes                      +----------+------------+---------+-----------+----------+-------+ Axillary      Full       Yes       Yes                      +----------+------------+---------+-----------+----------+-------+ Brachial      Full       Yes       Yes                      +----------+------------+---------+-----------+----------+-------+ Radial        Full                                          +----------+------------+---------+-----------+----------+-------+ Ulnar         Full                                           +----------+------------+---------+-----------+----------+-------+ Cephalic  None                                   Acute  +----------+------------+---------+-----------+----------+-------+  Summary:  Right: No evidence of thrombosis in the subclavian.  Left: No evidence of deep vein thrombosis in the upper extremity. Findings consistent with acute superficial vein thrombosis involving the left cephalic vein.  *See table(s) above for measurements and observations.  Diagnosing physician: Curt Jews MD Electronically signed by Curt Jews MD on 09/07/2019 at 5:22:06 PM.    Final     Scheduled Meds: . chlorhexidine  15 mL Mouth Rinse BID  . Chlorhexidine Gluconate Cloth  6 each Topical Daily  . enoxaparin (LOVENOX) injection  40 mg Subcutaneous Q24H  . feeding supplement (PRO-STAT SUGAR FREE 64)  30 mL Per Tube BID  . ferrous sulfate  300 mg Per Tube Q breakfast  . guaiFENesin-dextromethorphan  10 mL Per Tube Q6H  . insulin aspart  0-24 Units Subcutaneous Q6H  . mouth rinse  15 mL Mouth Rinse q12n4p  . metoCLOPramide (REGLAN) injection  5 mg Intravenous Q6H  . multivitamin with minerals  1 tablet Per Tube Daily  . senna-docusate  1 tablet Per Tube QHS  . sodium chloride flush  10-40 mL Intracatheter Q12H  . sodium chloride flush  3 mL Intravenous Q12H  . traZODone  25 mg Per Tube QHS   Continuous Infusions: . sodium chloride 10 mL/hr at 09/02/19 1900  . sodium chloride    . 0.9 % NaCl with KCl 20 mEq / L 10 mL/hr at 09/08/19 1327  . ampicillin-sulbactam (UNASYN) IV 3 g (09/08/19 1203)  . feeding supplement (OSMOLITE 1.5 CAL) 1,000 mL (09/07/19 2211)     LOS: 8 days   Marylu Lund, MD [facility] Pager On Amion  If 7PM-7AM, please contact night-coverage 09/08/2019, 2:27 PM

## 2019-09-08 NOTE — Progress Notes (Addendum)
Nutrition Follow-up  DOCUMENTATION CODES:   Severe malnutrition in context of chronic illness  INTERVENTION:   Check phosphorus  Transition to bolus feeding:  -2 carton/ARCs of Osmolite 1.5 QID -Free water flushes 150 ml before and after each feeding  Provides: 2840 kcals, 119 grams protein, 1448 ml free water (2648 ml with flushes)  NUTRITION DIAGNOSIS:   Severe Malnutrition related to chronic illness(throat cancer s/p radiation and surgery) as evidenced by severe fat depletion, severe muscle depletion.  Ongoing  GOAL:   Patient will meet greater than or equal to 90% of their needs  Met via TF  MONITOR:   Labs, Weight trends, TF tolerance, Skin, I & O's  REASON FOR ASSESSMENT:   Consult Enteral/tube feeding initiation and management  ASSESSMENT:   69 year old male who presented on 5/27 with a right-sided empyema. 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  5/27- s/p VATS for drainage of empyema and decortication of R lung 5/28- extubated   Pt tolerating continuous Osmolite 1.5 @ 70 ml/hr. Pt expresses he likes Osmolite 1.5 better than Boost that he typically uses via PEG at home. Pt would like to transition back to bolus feedings but stay with Osmolite 1.5. Made case manager aware of tube feeding formula change so home health arrangements can be made. Monitor for tolerance.   Admission weight: 58.9 kg  Current weight: 63.6 kg   I/O: +2,771 ml since admit  UOP: 6,075 ml x 24 hrs Chest tubes: 110 ml x 24 hrs    Drips: NS with 20 mEq KCl @ 10 ml/hr  Medications: ferrous sulfate, SS novolog, 5 mg reglan QID, MVI with minerals, senokot Labs: na 133 (L) CBG 135-170  Diet Order:   Diet Order            Diet NPO time specified  Diet effective now              EDUCATION NEEDS:   No education needs have been identified at this  time  Skin:  Skin Assessment: Skin Integrity Issues: Skin Integrity Issues:: Incisions Incisions: rt chest  Last BM:  6/2  Height:   Ht Readings from Last 1 Encounters:  09/01/19 5' 9.02" (1.753 m)    Weight:   Wt Readings from Last 1 Encounters:  09/08/19 63.6 kg    Ideal Body Weight:  72.7 kg  BMI:  Body mass index is 20.7 kg/m.  Estimated Nutritional Needs:   Kcal:  2500-2800 kcal  Protein:  115-130 grams  Fluid:  >/= 2.5 L/day  Mariana Single RD, LDN Clinical Nutrition Pager listed in [place]

## 2019-09-08 NOTE — Progress Notes (Signed)
Pt Hr 120's paged [facility] who ordered Prn lopressor for HR 120's by time order came through HR was 110's will continue to monitor

## 2019-09-08 NOTE — Care Management Important Message (Signed)
Important Message  Patient Details  Name: Brandon Miles MRN: [id number] Date of Birth: 1950-04-16   Medicare Important Message Given:  Yes     Shelda Altes 09/08/2019, 3:57 PM

## 2019-09-08 NOTE — Progress Notes (Signed)
Chest tube removed as per orders without difficulty.  Pt tolerated well.  Will continue to monitor.

## 2019-09-08 NOTE — Progress Notes (Signed)
Pt ambulated 425' with front wheel walker on room air.  Tolerated walk well, staggered a bit with turning/changing direction, but otherwise steady.  Offered to assist pt to sit up in recliner for a bit, but he declined.  Back to the bed, call bell within reach.

## 2019-09-08 NOTE — Progress Notes (Addendum)
      [address]       [place] [address]             [phone]       7 Days Post-Op Procedure(s) (LRB): VIDEO ASSISTED THORACOSCOPY (VATS)/EMPYEMA  AND DECORTICATION (Right) Insertion Central Line Adult (Left)  Subjective: Patient without specific complaints this am.  Objective: Vital signs in last 24 hours: Temp:  [97.9 F (36.6 C)-98.8 F (37.1 C)] 98 F (36.7 C) (06/03 0439) Pulse Rate:  [97-109] 100 (06/03 0439) Cardiac Rhythm: Sinus tachycardia (06/02 1900) Resp:  [11-16] 13 (06/03 0439) BP: (123-161)/(75-89) 137/88 (06/03 0439) SpO2:  [92 %-96 %] 96 % (06/03 0439) Weight:  [63.6 kg] 63.6 kg (06/03 0439)     Intake/Output from previous day: 06/02 0701 - 06/03 0700 In: 4091.6 [I.V.:1347.4; NG/GT:426; IV Piggyback:643.2] Out: 6185 [Urine:6075; Chest Tube:110]   Physical Exam:  Cardiovascular: RRR Pulmonary: Rhonchi this am Abdomen: Soft, non tender, bowel sounds present, Gastrostomy tube in place Extremities: No lower extremity edema. Wounds: Large dressing removed and wound is clean and dry.  No erythema or signs of infection. Chest Tube: to water seal, no air leak  Lab Results: CBC: Recent Labs    09/07/19 0606  WBC 4.3  HGB 11.7*  HCT 36.9*  PLT 423*   BMET:  Recent Labs    09/06/19 0435 09/07/19 0606  NA 136 132*  K 3.9 4.6  CL 97* 94*  CO2 34* 34*  GLUCOSE 108* 162*  BUN 7* 8  CREATININE 0.35* 0.41*  CALCIUM 7.3* 7.6*    PT/INR: No results for input(s): LABPROT, INR in the last 72 hours. ABG:  INR: Will add last result for INR, ABG once components are confirmed Will add last 4 CBG results once components are confirmed  Assessment/Plan:  1. CV - SR with HR in the 80's 2.  Pulmonary - On room air. Chest tube with 110 cc last 24 hours.  Chest tube was clamped yesterday and follow up CXR showed a small right basilar pneumothorax. Chest tube is to water seal and there is no air leak.CXR this am appears stable.  Will discuss with Dr. Prescott Gum if able to remove chest tube. Encourage incentive spirometer. 3. ID-on Vancomycin and Meropenem for empyema 4. CBGs 138/170/152. Pre op HGA1C 8. On Insulin. He will need to follow up with his medical doctor after discharge 5. Anemia-Last H and H 11.7 and 36.9.Marland KitchenOn Ferrous sulfate 6. GI-NPO, TFs at 70 ml/hr 7. Severely deconditioned-PT   Donielle M ZimmermanPA-C 09/08/2019,7:04 AM [phone]  DC chest tube today and get CXR in am Cont iv antibiotics while inpatient and send home on Levaquin 500 daily for 14 days. Leave skin sutures and chest tube sutures for removal in office  patient examined and medical record reviewed,agree with above note. Tharon Aquas Trigt III 09/08/2019

## 2019-09-08 NOTE — Progress Notes (Signed)
Mobility Specialist: Progress Note   09/08/19 1504  Mobility  Activity Refused mobility  Mobility performed by Mobility specialist   Pt refused ambulation or transfer to the chair.   [facility] Brandon Miles Mobility Specialist

## 2019-09-08 NOTE — Progress Notes (Signed)
   09/08/19 2230  Vitals  BP (!) 72/47  MAP (mmHg) (!) 56  Pulse Rate 97  ECG Heart Rate 97  Resp 14  Paged x blount about above BP checked both arms and manually she ordered cbc stat, 500 bolus and hold all sedating and BP meds

## 2019-09-08 NOTE — Progress Notes (Signed)
Pt refusing ambulation at this time.  Also refusing to sit up in recliner. Pt educated about importance of ambulation and encouraged to prepare for a walk later this afternoon.  He said "maybe".

## 2019-09-08 NOTE — Progress Notes (Signed)
Inpatient Diabetes Program Recommendations  AACE/ADA: New Consensus Statement on Inpatient Glycemic Control   Target Ranges:  Prepandial:   less than 140 mg/dL      Peak postprandial:   less than 180 mg/dL (1-2 hours)      Critically ill patients:  140 - 180 mg/dL   Results for JERMEL, PAWELSKI (MRN [id number]) as of 09/08/2019 12:13  Ref. Range 09/07/2019 08:08 09/07/2019 11:44 09/07/2019 16:12 09/07/2019 20:12 09/07/2019 23:40 09/08/2019 04:37 09/08/2019 08:01  Glucose-Capillary Latest Ref Range: 70 - 99 mg/dL 167 (H) 147 (H) 135 (H) 138 (H) 170 (H) 152 (H) 135 (H)  Results for TAYDON, KLASS (MRN [id number]) as of 09/08/2019 12:13  Ref. Range 09/01/2019 00:17  Hemoglobin A1C Latest Ref Range: 4.8 - 5.6 % 8.0 (H)   Review of Glycemic Control  Diabetes history: No Outpatient Diabetes medications: NA Current orders for Inpatient glycemic control: Novolog 0-24 units Q6H; Osmolite @ 70 ml/hr  NOTE: Went by again today to speak with patient regarding new DM dx. RN in room at the time. When asked patient if I could speak with him about new DM dx today, he wrote on his white board "You will need to talk to my wife."  Asked if his wife would be at the hospital today or if I should call her and her wrote that she would be at the hospital tomorrow around 1:00.  Informed patient I would come back again tomorrow to talk with him and his wife regarding new DM dx.   Thanks, Barnie Alderman, RN, MSN, CDE Diabetes Coordinator Inpatient Diabetes Program [phone] (Team Pager from 8am to 5pm)

## 2019-09-08 NOTE — Progress Notes (Signed)
   09/08/19 2100  Vitals  Pulse Rate (!) 118  ECG Heart Rate (!) 120  Resp 15  prn lopressor given per md order

## 2019-09-09 ENCOUNTER — Inpatient Hospital Stay (HOSPITAL_COMMUNITY): Payer: No Typology Code available for payment source

## 2019-09-09 LAB — BASIC METABOLIC PANEL
Anion gap: 6 (ref 5–15)
BUN: 9 mg/dL (ref 8–23)
CO2: 33 mmol/L — ABNORMAL HIGH (ref 22–32)
Calcium: 8 mg/dL — ABNORMAL LOW (ref 8.9–10.3)
Chloride: 94 mmol/L — ABNORMAL LOW (ref 98–111)
Creatinine, Ser: 0.42 mg/dL — ABNORMAL LOW (ref 0.61–1.24)
GFR calc Af Amer: >60 mL/min (ref >60)
GFR calc non Af Amer: >60 mL/min (ref >60)
Glucose, Bld: 106 mg/dL — ABNORMAL HIGH (ref 70–99)
Potassium: 4.3 mmol/L (ref 3.5–5.1)
Sodium: 133 mmol/L — ABNORMAL LOW (ref 135–145)

## 2019-09-09 LAB — ANAEROBIC CULTURE

## 2019-09-09 LAB — GLUCOSE, CAPILLARY
Glucose-Capillary: 103 mg/dL — ABNORMAL HIGH (ref 70–99)
Glucose-Capillary: 225 mg/dL — ABNORMAL HIGH (ref 70–99)

## 2019-09-09 MED ORDER — METFORMIN HCL 500 MG PO TABS: 500.0000 mg | ORAL_TABLET | Freq: Two times a day (BID) | ORAL | 0 refills | Status: AC

## 2019-09-09 MED ORDER — OSMOLITE 1.5 CAL PO LIQD: 474.0000 mL | Freq: Four times a day (QID) | ORAL | 0 refills | Status: AC

## 2019-09-09 MED ORDER — AMOXICILLIN-POT CLAVULANATE 875-125 MG PO TABS
1.0000 | ORAL_TABLET | Freq: Two times a day (BID) | ORAL | 0 refills | Status: DC
Start: 2019-09-09 — End: 2019-09-14

## 2019-09-09 MED ORDER — FERROUS SULFATE 300 (60 FE) MG/5ML PO SYRP: 300.0000 mg | ORAL_SOLUTION | Freq: Every day | ORAL | 3 refills | Status: AC

## 2019-09-09 MED ORDER — OXYCODONE HCL 5 MG PO TABS: 5.0000 mg | ORAL_TABLET | ORAL | 0 refills | Status: AC | PRN

## 2019-09-09 NOTE — Progress Notes (Addendum)
Left subclavian central line removed at this time. Telemetry discontinued. CCMD notified. Discharge instructions reviewed with patient at this time. All questions answered.

## 2019-09-09 NOTE — Progress Notes (Signed)
PT Cancellation Note  Patient Details Name: Brandon Miles MRN: [id number] DOB: 09/17/1950   Cancelled Treatment:    Reason Eval/Treat Not Completed: Patient declined, no reason specified Pt declined mobility today reports he is going home. D/c order in. Will follow if still in hospital.   [place] 09/09/2019, 11:48 AM Marisa Severin, PT, DPT Acute Rehabilitation Services Pager [phone] Office [phone]

## 2019-09-09 NOTE — Progress Notes (Addendum)
      [address]       [place] [address]             [phone]       8 Days Post-Op Procedure(s) (LRB): VIDEO ASSISTED THORACOSCOPY (VATS)/EMPYEMA  AND DECORTICATION (Right) Insertion Central Line Adult (Left)  Subjective: Patient sleeping and awakened;he states his breathing is ok  Objective: Vital signs in last 24 hours: Temp:  [97 F (36.1 C)-98.9 F (37.2 C)] 97.9 F (36.6 C) (06/04 0300) Pulse Rate:  [92-124] 97 (06/04 0500) Cardiac Rhythm: Normal sinus rhythm (06/04 0300) Resp:  [12-17] 14 (06/04 0500) BP: (72-136)/(47-89) 116/67 (06/04 0500) SpO2:  [90 %-98 %] 94 % (06/04 0500)     Intake/Output from previous day: 06/03 0701 - 06/04 0700 In: 715.2 [I.V.:685.2; NG/GT:30] Out: 3675 [Urine:3675]   Physical Exam:  Cardiovascular: RRR Pulmonary: Rhonchi bilaterally Abdomen: Soft, non tender, bowel sounds present, Gastrostomy tube in place Extremities: No lower extremity edema. Wounds: Wound is clean and dry.  No erythema or signs of infection.   Lab Results: CBC: Recent Labs    09/07/19 0606 09/08/19 2237  WBC 4.3 5.9  HGB 11.7* 10.4*  HCT 36.9* 32.6*  PLT 423* 386   BMET:  Recent Labs    09/08/19 0600 09/09/19 0528  NA 133* 133*  K 4.4 4.3  CL 93* 94*  CO2 33* 33*  GLUCOSE 142* 106*  BUN 9 9  CREATININE 0.38* 0.42*  CALCIUM 7.9* 8.0*    PT/INR: No results for input(s): LABPROT, INR in the last 72 hours. ABG:  INR: Will add last result for INR, ABG once components are confirmed Will add last 4 CBG results once components are confirmed  Assessment/Plan:  1. CV - HR up into the 120's late last evening as well as hypotension. Given Lopressor for HR then bolus IVF for low BP. Per primary. BP improved this am with SBP in the 110's. 2.  Pulmonary - On room air.  Await PA/LAT CXR results;ordered but not taken yet. Encourage incentive spirometer. 3. ID-on Vancomycin and Meropenem for empyema. Per Dr. Prescott Gum, transition  to Levaquin at discharge for 14 days. 4. CBGs 118/76/103. Pre op HGA1C 8. On Insulin. He will need to follow up with his medical doctor after discharge 5. Anemia-H and H yesterday 10.4 and 32.6. On Ferrous sulfate 6. GI-NPO, TFs at 70 ml/hr 7. Severely deconditioned-PT   Donielle M ZimmermanPA-C 09/09/2019,7:00 AM [phone]  CXR without pneumothorax Home on Augmentin 14 days  Will remove all skin sutures at office visit patient examined and medical record reviewed,agree with above note. Tharon Aquas Trigt III 09/09/2019

## 2019-09-09 NOTE — Progress Notes (Addendum)
Inpatient Diabetes Program Recommendations  AACE/ADA: New Consensus Statement on Inpatient Glycemic Control   Target Ranges:  Prepandial:   less than 140 mg/dL      Peak postprandial:   less than 180 mg/dL (1-2 hours)      Critically ill patients:  140 - 180 mg/dL   Results for Brandon Miles, Brandon Miles (MRN [id number]) as of 09/09/2019 11:23  Ref. Range 09/07/2019 23:40 09/08/2019 04:37 09/08/2019 08:01 09/08/2019 12:26 09/08/2019 15:57 09/08/2019 23:45 09/09/2019 05:12  Glucose-Capillary Latest Ref Range: 70 - 99 mg/dL 170 (H) 152 (H) 135 (H) 153 (H) 118 (H) 76 103 (H)  Results for Brandon Miles, Brandon Miles (MRN [id number]) as of 09/09/2019 11:23  Ref. Range 09/01/2019 00:17  Hemoglobin A1C Latest Ref Range: 4.8 - 5.6 % 8.0 (H)   Review of Glycemic Control  Diabetes history:No Outpatient Diabetes medications:NA Current orders for Inpatient glycemic control:Novolog 0-24 units Q6H; Osmolite @ 70 ml/hr   NOTE: Called patient's wife to see what time she would be at the hospital today. She states that she does not feel well (cough, sore throat, increase tiredness) and her grandson is also sick with similar symptoms so she is unsure if she will come to the hospital to pick patient up for discharge or have someone else pick him up.  Therefore, went ahead and spoke with patient's wife over the phone regarding new DM dx. She reports that patient was dx with DM 8 years ago when he had initial surgery from cancer. She states that he was taking DM medication and checking glucose but she can not remember what he was taking. She states that his doctor later told him he did not have DM and could stop the medication. Discussed A1C results (8% on 09/01/2019) and explained what an A1C is and informed patient's wife that current A1C indicates an average glucose of 186 mg/dl over the past 2-3 months. Reviewed basic pathophysiology of DM Type 2, basic home care, importance of checking CBGs and maintaining good CBG control to prevent long-term  and short-term complications. Reviewed glucose and A1C goals. Informed her that Living Well with diabetes booklet has been ordered and patient should be bringing it home. Encouraged her to read the book once received to enhance knowledge about DM.  Encouraged her to have patient check glucose as directed on discharge.  Explained that per discharge instructions, patient is prescribed Metformin 500 mg BID.  Encouraged her to keep a glucose log or take glucometer to follow up appointments with PCP.  Patient's wife verbalized understanding of information discussed and she states that she has no further questions at this time related to diabetes.     Addendum 09/09/19@12 :30-Spoke with patient regarding DM dx. Informed patient that I spoke with his wife regarding DM dx and that his wife informed me that he was dx with DM 8 years ago when he had initial surgery from cancer. Patient shook his head "yes" when asked if he remembered being dx with DM then. Discussed A1C of 8% on 09/01/2019 indicating an average glucose of 186 mg/dl. Explained that he will need to start checking glucose as MD directs on discharge paperwork and taking Metformin as prescribed. Discussed glucose and A1C goals. Explained that he will need to monitor glucose, take DM medication, and follow up with PCP regarding DM control. Explained that if glucose is running high, his PCP may need to increase Metformin dose or add an additional DM medication. Patient has living well with DM book in his belongings bag  at bedside. Asked patient to read through the book and also have his wife read through the book to enhance knowledge about DM. Patient shook head "yes" when asked if he understands information discussed.   Thanks, Barnie Alderman, RN, MSN, CDE Diabetes Coordinator Inpatient Diabetes Program [phone] (Team Pager from 8am to 5pm)

## 2019-09-09 NOTE — TOC Transition Note (Addendum)
Transition of Care [facility]) - CM/SW Discharge Note   Patient Details  Name: Brandon Miles MRN: [id number] Date of Birth: [birth year]  [facility] CM/SW Contact:  Verdell Carmine, RN Phone Number: 09/09/2019, 11:19 AM   Clinical Narrative:    [name]  Admitted for Empyema he is a [facility] patient Dr Wyline Copas contacted  Dr Marcello Moores and placed all  Orders  Post discharge for him to review, .(faxed to him)  Final next level of care: Home/Self Care Barriers to Discharge: No Barriers Identified   Patient Goals and CMS Choice Patient states their goals for this hospitalization and ongoing recovery are:: Go home with wife      Discharge Placement   Home with PEG, home with wife                      Discharge Plan and [place] with wife                                 Social Determinants of Health (SDOH) Interventions     Readmission Risk Interventions No flowsheet data found.

## 2019-09-09 NOTE — Discharge Summary (Addendum)
Physician Discharge Summary  Brandon Miles IHK:[id number] DOB: 01-29-1951 DOA: 08/31/2019  PCP: Laurey Morale, MD  Admit date: 08/31/2019 Discharge date: 09/09/2019  Admitted From: Home Disposition:  Home  Recommendations for Outpatient Follow-up:  1. Follow up with PCP in 1-2 weeks 2. Follow up with CT surgery as scheduled  NCCSR reviewed. No recent controlled substances since 2019, confirmed with pt's provider. Given recent surgery and chest tube, will provide limited quantity of oxycodone  Home Health:PT     Discharge Condition:improved CODE STATUS:Full Diet recommendation: Tube feeding   Brief/Interim Summary: 69 year old with history of hep C, throat cancer status post radiation, surgery, PEG tube placement presented from [facility] for right-sided empyema. Had progressive shortness of breath over past few weeks, underwent outpatient right-sided thoracentesis by IR, 300 cc of fluid was removed and sent to [facility] showed thickened walled right-sided pleural effusion with compressive atelectasis, right middle and upper lobe groundglass opacities, started on vancomycin and Zosyn. Chest tube was placed by IR at [facility], PEG tube was exchanged with a Foley catheter and transferred to [facility] for surgical consultation and decortication.  Discharge Diagnoses:  Principal Problem:   Empyema of right pleural space (HCC) Active Problems:   Uncontrolled type 2 diabetes mellitus with hyperglycemia, without long-term current use of insulin (HCC)   Hyponatremia   Severe protein-calorie malnutrition (HCC)   Empyema, right (HCC)  Right pleural space empyema with compressive atelectasis, middle and upper lobe groundglass opacity -Was continued on unasyn -CT surgery following and pt now s/p VATS for drainage of empyema with decortication of the right lung on 5/27 -Chest tube since removed on 6/3 -Per CT surgery, OK for d/c and to complete 14 days of  augmentin -Per pt's wife, there were questions if pt would benefit from home suction device. Currently lungs sound clear and RN reports no issues with needing suctioning at this time. Discussed with [facility] provider who will follow up on this as outpatient  Insulin-dependent diabetes mellitus type 2, uncontrolled due to hyperglycemia -Hemoglobin A1c 8.0. New diagnosis -continued on Insulin sliding scale and Accu-Check while in hospital -Diabetic coordinator following, to educate pt on diabetes hx -Glycemic trends overall stable thus far -Will discharge on metformin 500mg  BID, plan to titrate with goal of euglycemia -May consider initiating low dose ACEI for renal protection. Low blood pressures noted briefly this admit, thus will hold off at this time  Hyponatremia, 128 upon admission -Improved after hydration earlier  Severe protein calorie malnutrition -Nutrition consulted. Tube feeding via PEG tube-continued per nutrition recs -Per Dietitian, pt had been on Boost tube feeding however prefers Osmolite 1.5  Depression -Continued on Cymbalta as tolertaed  Iron deficiency anemia -Continue iron supplementation -Hemodynamically stable   Discharge Instructions   Allergies as of 09/09/2019      Reactions   Codeine    REACTION: vomitting      Medication List    STOP taking these medications   fentaNYL 25 MCG/HR Commonly known as: DURAGESIC   oxyCODONE 5 MG/5ML solution Commonly known as: ROXICODONE Replaced by: oxyCODONE 5 MG immediate release tablet     TAKE these medications   amoxicillin-clavulanate 875-125 MG tablet Commonly known as: Augmentin Place 1 tablet into feeding tube 2 (two) times daily for 14 days.   feeding supplement (OSMOLITE 1.5 CAL) Liqd Place 474 mLs into feeding tube 4 (four) times daily.   ferrous sulfate 300 (60 Fe) MG/5ML syrup Place 5 mLs (300 mg total) into feeding tube daily  with breakfast.   gi cocktail Susp suspension Swish and spit  5 mLs 4 (four) times daily as needed (for pain). Shake well.   magic mouthwash Soln Swish and spit 10 mLs 4 (four) times daily as needed (for thrush).   metFORMIN 500 MG tablet Commonly known as: GLUCOPHAGE Place 1 tablet (500 mg total) into feeding tube 2 (two) times daily with a meal.   ondansetron 4 MG tablet Commonly known as: ZOFRAN Take 1 tablet (4 mg total) by mouth every 6 (six) hours.   oxyCODONE 5 MG immediate release tablet Commonly known as: Oxy IR/ROXICODONE Place 1 tablet (5 mg total) into feeding tube every 4 (four) hours as needed for moderate pain. Replaces: oxyCODONE 5 MG/5ML solution   prochlorperazine 10 MG tablet Commonly known as: COMPAZINE Give 10 mg by tube every 6 (six) hours as needed (for nausea/vomiting).      Follow-up Information    Prescott Gum, Collier Salina, MD. Go on 09/14/2019.   Specialty: Cardiothoracic Surgery Why: PA/LAT CXR to be taken (at [facility] which is in the same building as Dr. Lucianne Lei Trigt's office) on 06/09 at 2:30 pm;Appointment time is at 3:00 pm Contact information: [address] [place] [address] [phone]          Allergies  Allergen Reactions  . Codeine     REACTION: vomitting    Consultations:  CT surgery  Procedures/Studies: DG Chest 1 View  Result Date: 09/05/2019 CLINICAL DATA:  Follow-up chest tube EXAM: CHEST  1 VIEW COMPARISON:  Chest radiograph from one day prior. FINDINGS: Left subclavian central venous catheter terminates at the junction of the left brachiocephalic vein and SVC. Interval removal of 1 of the 2 right chest tubes, with residual solitary right upper chest tube in place. Stable cardiomediastinal silhouette with top-normal heart size. Small right hydropneumothorax is overall mildly increased with new basilar right pneumothorax component. No left pneumothorax. No left pleural effusion. Persistent patchy right lung base opacity, unchanged. No overt pulmonary edema. IMPRESSION:  1. Overall mildly increased small right hydropneumothorax with new basilar right pneumothorax component status post removal of 1 of the 2 right chest tubes. 2. Stable patchy right lung base opacity, favor atelectasis. Electronically Signed   By: Ilona Sorrel M.D.   On: 09/05/2019 08:49   DG Chest 1 View  Result Date: 09/04/2019 CLINICAL DATA:  Right-sided empyema EXAM: CHEST  1 VIEW COMPARISON:  Sep 03, 2019 FINDINGS: Chest tubes are present on the right. Central catheter tip at junction of left innominate vein and superior vena cava. No evident pneumothorax. There is persistent pleural thickening on the right with atelectasis in the right base. Lungs otherwise are clear. Heart size and pulmonary vascularity are normal. No adenopathy. No bone lesions. IMPRESSION: Tube and catheter positions as described without pneumothorax. Persistent pleural thickening right base with right base atelectasis. No new opacity. Stable cardiac silhouette. Electronically Signed   By: Lowella Grip III M.D.   On: 09/04/2019 09:28   CT CHEST W CONTRAST  Result Date: 09/01/2019 CLINICAL DATA:  Abnormal chest radiograph, pleural effusion, empyema EXAM: CT CHEST WITH CONTRAST TECHNIQUE: Multidetector CT imaging of the chest was performed during intravenous contrast administration. CONTRAST:  27mL OMNIPAQUE IOHEXOL 300 MG/ML  SOLN COMPARISON:  CT 08/30/2019 FINDINGS: Cardiovascular: Upper limits normal cardiac size. Three-vessel coronary artery disease is noted. Small pericardial effusion, stable from prior. Atherosclerotic plaque within the normal caliber aorta. Three vessel branching of the aortic arch with minimal plaque in the  proximal subclavian arteries. Remaining great vessels are unremarkable. Central pulmonary arteries are normal caliber. Contrast bolus is inadequate for assessment of pulmonary emboli. Mediastinum/Nodes: No mediastinal fluid or gas. Normal thyroid gland and thoracic inlet. There are extensive secretions  throughout the airways including abrupt narrowing of the central airways to the right middle and lower lobe (3/80, 3/72). High attenuation contrast material within the thoracic esophagus, correlate for reflux. Scattered low-attenuation subcentimeter mediastinal and hilar nodes are likely reactive. No worrisome mediastinal, hilar or axillary adenopathy. Lungs/Pleura: There is a right pigtail pleural drain entering at the sixth interspace, mid axillary line and positioned posteriorly in the mid to lower lung within a large, thick-walled air and fluid containing collection compatible with partially drained empyema seen previously. Overall size of this collection has significantly decreased from prior exam but does remain moderate in overall extent. There is extensive visceral and parietal pleural thickening is noted throughout the right hemithorax. Concave margins of the an expanded lung may suggest some in trapped lung with failed re-expansion secondary to the active pleural inflammation. There is extensive residual passive atelectasis throughout the right hemithorax including near complete atelectatic collapse of the right lower lobe. Associated diffuse airways thickening is again seen. There scattered areas of peripheral ground-glass in the right middle and upper lobe. A small volume left pleural effusion without complicating pleural thickening is noted. Several fluid-filled lower lobe airways are present as well in the left lower lobe with adjacent peribronchovascular opacity likely reflecting developing consolidation/inflammation. Upper Abdomen: Mild periportal edema within the liver as well as small volume perihepatic ascites. Heterogeneous attenuation of the spleen may be related to contrast timing. Musculoskeletal: Multilevel degenerative changes are present in the imaged portions of the spine. No acute osseous abnormality or suspicious osseous lesion. Moderate circumferential body wall edema. No suspicious  chest wall lesions. IMPRESSION: 1. Right pigtail pleural drain is present within a large, thick-walled air and fluid containing collection compatible with partially drained empyema seen previously. Overall size of this collection has significantly decreased from prior exam but does remain moderate in overall extent with increasing air component. 2. Extensive visceral and parietal pleural thickening throughout the right hemithorax with concave margins of the incompletely reexpanded lung may suggest some lung entrapment with failed re-expansion secondary to the active pleural inflammation. 3. Extensive passive atelectasis throughout the right hemithorax, underlying consolidation is not excluded. 4. Small volume left pleural effusion without complicating pleural thickening. 5. Extensive fluid secretions throughout the airways predominantly in the right mid to lower lung with narrowing of several middle and lower lobe central airways and with multiple fluid filled airways in the left lung base as well as developing peribronchial consolidation concerning for sequela of aspiration. 6. Persistent patchy ground-glass opacities in right middle and upper lobes also likely reflect some postobstructive pneumonitis or infection. 7. Features of anasarca with periportal edema, circumferential body wall edema and small volume ascites. 8. Aortic Atherosclerosis (ICD10-I70.0). 9. Coronary artery calcifications are present. Please note that the presence of coronary artery calcium documents the presence of coronary artery disease, the severity of this disease and any potential stenosis cannot be assessed on this non-gated CT examination. Assessment for potential risk factor modification, dietary therapy or pharmacologic therapy may be warranted. Electronically Signed   By: Lovena Le M.D.   On: 09/01/2019 02:26   DG CHEST PORT 1 VIEW  Result Date: 09/08/2019 CLINICAL DATA:  Pneumothorax and chest tube EXAM: PORTABLE CHEST 1 VIEW  COMPARISON:  Yesterday FINDINGS: Right-sided chest tube in  place. Left subclavian line with tip near the SVC origin. Unchanged pleural fluid/thickening along the lateral right chest wall small gas component. The left lung is clear. Normal heart size. IMPRESSION: Unchanged postoperative right chest. Electronically Signed   By: Monte Fantasia M.D.   On: 09/08/2019 08:17   DG Chest Port 1 View  Result Date: 09/07/2019 CLINICAL DATA:  Chest tube EXAM: PORTABLE CHEST 1 VIEW COMPARISON:  Earlier same day FINDINGS: Similar right hydropneumothorax. Right basilar atelectasis/consolidation is unchanged. Improved lung aeration at the left base. Stable cardiomediastinal contours. IMPRESSION: Similar right hydropneumothorax with chest tube present. Persistent right basilar atelectasis/consolidation. Improved aeration at the left lung base. Electronically Signed   By: Macy Mis M.D.   On: 09/07/2019 16:00   DG CHEST PORT 1 VIEW  Result Date: 09/07/2019 CLINICAL DATA:  Pneumothorax follow-up EXAM: PORTABLE CHEST 1 VIEW COMPARISON:  Yesterday FINDINGS: Left subclavian line with tip near the SVC origin. Right-sided chest tube in stable position. No convincing pneumothorax. Stable pleural fluid/thickening at the right base and streaky atelectatic type opacity. New indistinct left lower lobe opacity. Stable heart size and mediastinal contours. IMPRESSION: 1. Essentially stable postoperative right chest. No definite pneumothorax. 2. New left lower lobe opacity. Electronically Signed   By: Monte Fantasia M.D.   On: 09/07/2019 10:42   DG CHEST PORT 1 VIEW  Result Date: 09/06/2019 CLINICAL DATA:  Congestion.  Cough. EXAM: PORTABLE CHEST 1 VIEW COMPARISON:  09/05/2019. FINDINGS: Left central line, right chest tube in stable position. Stable mild right hydropneumothorax. Stable atelectatic changes/infiltrate right lung base. No other focal abnormality identified. IMPRESSION: Left central line, right chest tube in stable  position. Stable mild right hydropneumothorax. Stable atelectatic changes/infiltrate right lung base. Electronically Signed   By: Marcello Moores  Register   On: 09/06/2019 08:35   DG CHEST PORT 1 VIEW  Result Date: 09/03/2019 CLINICAL DATA:  Follow-up pneumothorax EXAM: PORTABLE CHEST 1 VIEW COMPARISON:  09/02/2019 FINDINGS: Cardiac shadow is stable. Endotracheal tube has been removed in the interval. Left subclavian central line remains in place. Two chest tubes are noted on the right without evidence of pneumothorax. Pleural thickening remains laterally likely related to residual empyema. IMPRESSION: No pneumothorax is noted. The overall appearance is stable from the previous day with the exception of removal of the endotracheal tube. Electronically Signed   By: Inez Catalina M.D.   On: 09/03/2019 09:06   DG CHEST PORT 1 VIEW  Result Date: 09/02/2019 CLINICAL DATA:  History of pneumothorax. EXAM: PORTABLE CHEST 1 VIEW COMPARISON:  Chest x-ray 09/01/2019. FINDINGS: Endotracheal tube in stable position. Left subclavian line in stable position. 2 right chest tubes in stable position. Tiny right base pneumothorax again cannot be excluded. Right-sided pleural thickening again noted. Mild atelectatic changes right lung base. Heart size stable. IMPRESSION: Lines and tubes including 2 right chest tubes in stable position. Tiny right base pneumothorax again cannot be excluded. Right-sided pleural thickening again noted. Electronically Signed   By: Marcello Moores  Register   On: 09/02/2019 08:51   DG Chest Portable 1 View  Result Date: 09/01/2019 CLINICAL DATA:  Post video-assisted thoracoscopy, empyema. EXAM: PORTABLE CHEST 1 VIEW COMPARISON:  Preoperative radiograph and CT earlier today. FINDINGS: Endotracheal tube tip is 2.9 cm from the carina. Left subclavian central venous catheter tip the region of the upper SVC. Right pigtail catheter is been removed, there are 2 right chest tubes in place. Residual pleural  thickening/loculated effusion laterally. Right hydropneumothorax is diminished with decreased air component and residual pleural  thickening/partially loculated effusion laterally. Suspect small residual extrapleural air at the right lung base. Scattered atelectasis throughout the right lung. Heart is normal in size with normal mediastinal contours. No left pneumothorax. Small left pleural effusion on CT is not well demonstrated by radiograph. IMPRESSION: 1. Endotracheal tube tip 2.9 cm from the carina. 2. Tip of the left subclavian central line in the upper SVC. No left pneumothorax. 3. Post interval right VATS with removal of pigtail catheter, placement of 2 right chest tubes, and diminished empyema. There is residual pleural partially loculated fluid laterally and suspected small air-fluid component at the base. Electronically Signed   By: Keith Rake M.D.   On: 09/01/2019 19:52   DG Chest Port 1 View  Result Date: 09/01/2019 CLINICAL DATA:  Follow-up empyema. EXAM: PORTABLE CHEST 1 VIEW COMPARISON:  Chest CT from earlier today at 1:43 a.m. FINDINGS: Right chest tube is identified and appears unchanged in position from previous exam. No change volume of loculated empyema containing air overlying the right lung. Diffuse hazy lung opacities within the right lung appears similar to previous exam. Left lung clear. IMPRESSION: 1. No change in volume of loculated empyema containing air overlying the right lung. 2. Stable position of right chest tube. Electronically Signed   By: Kerby Moors M.D.   On: 09/01/2019 08:46   VAS [place] UPPER EXTREMITY VENOUS DUPLEX  Result Date: 09/07/2019 UPPER VENOUS STUDY  Indications: Swelling Comparison Study: No prior study Performing Technologist: Maudry Mayhew MHA, RDMS, RVT, RDCS  Examination Guidelines: A complete evaluation includes B-mode imaging, spectral Doppler, color Doppler, and power Doppler as needed of all accessible portions of each vessel. Bilateral  testing is considered an integral part of a complete examination. Limited examinations for reoccurring indications may be performed as noted.  Right Findings: +----------+------------+---------+-----------+----------+-------+ RIGHT     CompressiblePhasicitySpontaneousPropertiesSummary +----------+------------+---------+-----------+----------+-------+ Subclavian               Yes       Yes                      +----------+------------+---------+-----------+----------+-------+  Left Findings: +----------+------------+---------+-----------+----------+-------+ LEFT      CompressiblePhasicitySpontaneousPropertiesSummary +----------+------------+---------+-----------+----------+-------+ IJV           Full       Yes       Yes                      +----------+------------+---------+-----------+----------+-------+ Subclavian    Full       Yes       Yes                      +----------+------------+---------+-----------+----------+-------+ Axillary      Full       Yes       Yes                      +----------+------------+---------+-----------+----------+-------+ Brachial      Full       Yes       Yes                      +----------+------------+---------+-----------+----------+-------+ Radial        Full                                          +----------+------------+---------+-----------+----------+-------+ Ulnar  Full                                          +----------+------------+---------+-----------+----------+-------+ Cephalic      None                                   Acute  +----------+------------+---------+-----------+----------+-------+  Summary:  Right: No evidence of thrombosis in the subclavian.  Left: No evidence of deep vein thrombosis in the upper extremity. Findings consistent with acute superficial vein thrombosis involving the left cephalic vein.  *See table(s) above for measurements and observations.  Diagnosing  physician: Curt Jews MD Electronically signed by Curt Jews MD on 09/07/2019 at 5:22:06 PM.    Final     Subjective: Eager to go home  Discharge Exam: Vitals:   09/09/19 0500 09/09/19 0756  BP: 116/67 131/82  Pulse: 97 (!) 110  Resp: 14 20  Temp:  98.4 F (36.9 C)  SpO2: 94% 96%   Vitals:   09/09/19 0300 09/09/19 0400 09/09/19 0500 09/09/19 0756  BP: 113/89 120/75 116/67 131/82  Pulse: 99 96 97 (!) 110  Resp: 14 15 14 20   Temp: 97.9 F (36.6 C)   98.4 F (36.9 C)  TempSrc: Oral   Oral  SpO2: 94% 94% 94% 96%  Weight:      Height:        General: Pt is alert, awake, not in acute distress Cardiovascular: RRR, S1/S2 +, no rubs, no gallops Respiratory: CTA bilaterally, no wheezing, no rhonchi Abdominal: Soft, NT, ND, bowel sounds + Extremities: no edema, no cyanosis   The results of significant diagnostics from this hospitalization (including imaging, microbiology, ancillary and laboratory) are listed below for reference.     Microbiology: Recent Results (from the past 240 hour(s))  Surgical pcr screen     Status: None   Collection Time: 09/01/19  3:03 AM   Specimen: Nasal Mucosa; Nasal Swab  Result Value Ref Range Status   MRSA, PCR NEGATIVE NEGATIVE Final   Staphylococcus aureus NEGATIVE NEGATIVE Final    Comment: (NOTE) The Xpert SA Assay (FDA approved for NASAL specimens in patients 49 years of age and older), is one component of a comprehensive surveillance program. It is not intended to diagnose infection nor to guide or monitor treatment. Performed at [facility], [place] [address] [place], [place] [address]   Body fluid culture     Status: None   Collection Time: 09/01/19  4:15 PM   Specimen: PATH Cytology Pleural fluid; Body Fluid  Result Value Ref Range Status   Specimen Description FLUID PLEURAL  Final   Special Requests NONE  Final   Gram Stain   Final    ABUNDANT WBC PRESENT, PREDOMINANTLY PMN NO ORGANISMS SEEN    Culture   Final    NO  GROWTH 3 DAYS Performed at [facility], [address], [place], [place] [address]    Report Status 09/05/2019 FINAL  Final  Fungus Culture With Stain     Status: None (Preliminary result)   Collection Time: 09/01/19  4:15 PM   Specimen: PATH Cytology Pleural fluid; Body Fluid  Result Value Ref Range Status   Fungus Stain Final report  Final    Comment: (NOTE) Performed At: [facility] [address] [place], [place] [id number]  Rush Farmer MD [id number]    Fungus (Mycology) Culture PENDING  Incomplete   Fungal Source FLUID  Final    Comment: PLEURAL  Acid Fast Smear (AFB)     Status: None   Collection Time: 09/01/19  4:15 PM   Specimen: PATH Cytology Pleural fluid; Body Fluid  Result Value Ref Range Status   AFB Specimen Processing Concentration  Final   Acid Fast Smear Negative  Final    Comment: (NOTE) Performed At: [facility] [place], [place] [id number] Rush Farmer MD [id number]    Source (AFB) FLUID  Final    Comment: PLEURAL  Anaerobic culture     Status: Abnormal (Preliminary result)   Collection Time: 09/01/19  4:15 PM   Specimen: Fluid  Result Value Ref Range Status   Specimen Description FLUID PLEURAL  Final   Special Requests NONE  Final   Culture (A)  Final    FUSOBACTERIUM NUCLEATUM BETA LACTAMASE NEGATIVE Performed at [facility], [place] [address], [place], [place] [address]    Report Status PENDING  Incomplete  Fungus Culture Result     Status: None   Collection Time: 09/01/19  4:15 PM  Result Value Ref Range Status   Result 1 Comment  Final    Comment: (NOTE) KOH/Calcofluor preparation:  no fungus observed. Performed At: [facility] [place], [place] [id number] Rush Farmer MD OA:[id number]   SARS Coronavirus 2 by RT PCR (hospital order, performed in [facility] hospital lab) Nasopharyngeal Nasopharyngeal Swab     Status: None   Collection Time: 09/01/19  5:23 PM    Specimen: Nasopharyngeal Swab  Result Value Ref Range Status   SARS Coronavirus 2 NEGATIVE NEGATIVE Final    Comment: (NOTE) SARS-CoV-2 target nucleic acids are NOT DETECTED. The SARS-CoV-2 RNA is generally detectable in upper and lower respiratory specimens during the acute phase of infection. The lowest concentration of SARS-CoV-2 viral copies this assay can detect is 250 copies / mL. A negative result does not preclude SARS-CoV-2 infection and should not be used as the sole basis for treatment or other patient management decisions.  A negative result may occur with improper specimen collection / handling, submission of specimen other than nasopharyngeal swab, presence of viral mutation(s) within the areas targeted by this assay, and inadequate number of viral copies (<250 copies / mL). A negative result must be combined with clinical observations, patient history, and epidemiological information. Fact Sheet for Patients:   StrictlyIdeas.no Fact Sheet for Healthcare Providers: BankingDealers.co.za This test is not yet approved or cleared  by the [place] FDA and has been authorized for detection and/or diagnosis of SARS-CoV-2 by FDA under an Emergency Use Authorization (EUA).  This EUA will remain in effect (meaning this test can be used) for the duration of the COVID-19 declaration under Section 564(b)(1) of the Act, 21 U.S.C. section 360bbb-3(b)(1), unless the authorization is terminated or revoked sooner. Performed at [facility], [place] [address], [place], [place] [address]      Labs: BNP (last 3 results) No results for input(s): BNP in the last 8760 hours. Basic Metabolic Panel: Recent Labs  Lab 09/03/19 0332 09/03/19 0332 09/04/19 0422 09/04/19 0422 09/04/19 1345 09/04/19 1725 09/05/19 0430 09/05/19 0957 09/06/19 0435 09/06/19 0436 09/07/19 0606 09/08/19 0600 09/09/19 0528  NA 134*   < > 135   < >  --    --   --  134* 136  --  132* 133* 133*  K 3.8   < > 3.6   < >  --   --   --  4.0 3.9  --  4.6 4.4 4.3  CL 99   < > 100   < >  --   --   --  98 97*  --  94* 93* 94*  CO2 29   < > 29   < >  --   --   --  31 34*  --  34* 33* 33*  GLUCOSE 134*   < > 101*   < >  --   --   --  143* 108*  --  162* 142* 106*  BUN 8   < > <5*   < >  --   --   --  7* 7*  --  8 9 9   CREATININE 0.44*   < > 0.41*   < >  --   --   --  0.46* 0.35*  --  0.41* 0.38* 0.42*  CALCIUM 7.6*   < > 7.5*   < >  --   --   --  7.3* 7.3*  --  7.6* 7.9* 8.0*  MG 1.8  --  1.8  --   --   --  2.0  --  1.9  --   --   --   --   PHOS  --   --   --   --  2.0* 1.7* 1.8*  --   --  2.2*  --   --   --    < > = values in this interval not displayed.   Liver Function Tests: Recent Labs  Lab 09/03/19 0332  AST 43*  ALT 23  ALKPHOS 60  BILITOT 0.3  PROT 5.1*  ALBUMIN 1.8*   No results for input(s): LIPASE, AMYLASE in the last 168 hours. No results for input(s): AMMONIA in the last 168 hours. CBC: Recent Labs  Lab 09/03/19 0332 09/04/19 0422 09/07/19 0606 09/08/19 2237  WBC 15.1* 6.2 4.3 5.9  NEUTROABS  --   --   --  4.2  HGB 12.4* 11.4* 11.7* 10.4*  HCT 38.3* 36.1* 36.9* 32.6*  MCV 89.1 90.7 91.1 90.8  PLT 628* 366 423* 386   Cardiac Enzymes: No results for input(s): CKTOTAL, CKMB, CKMBINDEX, TROPONINI in the last 168 hours. BNP: Invalid input(s): POCBNP CBG: Recent Labs  Lab 09/08/19 0801 09/08/19 1226 09/08/19 1557 09/08/19 2345 09/09/19 0512  GLUCAP 135* 153* 118* 76 103*   D-Dimer No results for input(s): DDIMER in the last 72 hours. Hgb A1c No results for input(s): HGBA1C in the last 72 hours. Lipid Profile No results for input(s): CHOL, HDL, LDLCALC, TRIG, CHOLHDL, LDLDIRECT in the last 72 hours. Thyroid function studies No results for input(s): TSH, T4TOTAL, T3FREE, THYROIDAB in the last 72 hours.  Invalid input(s): FREET3 Anemia work up No results for input(s): VITAMINB12, FOLATE, FERRITIN, TIBC, IRON,  RETICCTPCT in the last 72 hours. Urinalysis    Component Value Date/Time   COLORURINE YELLOW 09/01/2019 0032   APPEARANCEUR CLEAR 09/01/2019 0032   LABSPEC 1.024 09/01/2019 0032   PHURINE 7.0 09/01/2019 0032   GLUCOSEU NEGATIVE 09/01/2019 0032   HGBUR NEGATIVE 09/01/2019 0032   BILIRUBINUR NEGATIVE 09/01/2019 0032   KETONESUR NEGATIVE 09/01/2019 0032   PROTEINUR NEGATIVE 09/01/2019 0032   NITRITE NEGATIVE 09/01/2019 0032   LEUKOCYTESUR NEGATIVE 09/01/2019 0032   Sepsis Labs Invalid input(s): PROCALCITONIN,  WBC,  LACTICIDVEN Microbiology Recent Results (from the past  240 hour(s))  Surgical pcr screen     Status: None   Collection Time: 09/01/19  3:03 AM   Specimen: Nasal Mucosa; Nasal Swab  Result Value Ref Range Status   MRSA, PCR NEGATIVE NEGATIVE Final   Staphylococcus aureus NEGATIVE NEGATIVE Final    Comment: (NOTE) The Xpert SA Assay (FDA approved for NASAL specimens in patients 50 years of age and older), is one component of a comprehensive surveillance program. It is not intended to diagnose infection nor to guide or monitor treatment. Performed at [facility], [place] [address], [place], [place] [address]   Body fluid culture     Status: None   Collection Time: 09/01/19  4:15 PM   Specimen: PATH Cytology Pleural fluid; Body Fluid  Result Value Ref Range Status   Specimen Description FLUID PLEURAL  Final   Special Requests NONE  Final   Gram Stain   Final    ABUNDANT WBC PRESENT, PREDOMINANTLY PMN NO ORGANISMS SEEN    Culture   Final    NO GROWTH 3 DAYS Performed at [facility], [address], [place], [place] [address]    Report Status 09/05/2019 FINAL  Final  Fungus Culture With Stain     Status: None (Preliminary result)   Collection Time: 09/01/19  4:15 PM   Specimen: PATH Cytology Pleural fluid; Body Fluid  Result Value Ref Range Status   Fungus Stain Final report  Final    Comment: (NOTE) Performed At: [facility] [place], [place] [id number] Rush Farmer MD [id number]    Fungus (Mycology) Culture PENDING  Incomplete   Fungal Source FLUID  Final    Comment: PLEURAL  Acid Fast Smear (AFB)     Status: None   Collection Time: 09/01/19  4:15 PM   Specimen: PATH Cytology Pleural fluid; Body Fluid  Result Value Ref Range Status   AFB Specimen Processing Concentration  Final   Acid Fast Smear Negative  Final    Comment: (NOTE) Performed At: [facility] [place], [place] [id number] Rush Farmer MD [id number]    Source (AFB) FLUID  Final    Comment: PLEURAL  Anaerobic culture     Status: Abnormal (Preliminary result)   Collection Time: 09/01/19  4:15 PM   Specimen: Fluid  Result Value Ref Range Status   Specimen Description FLUID PLEURAL  Final   Special Requests NONE  Final   Culture (A)  Final    FUSOBACTERIUM NUCLEATUM BETA LACTAMASE NEGATIVE Performed at [facility], [place] [address] [place], [place] [address]    Report Status PENDING  Incomplete  Fungus Culture Result     Status: None   Collection Time: 09/01/19  4:15 PM  Result Value Ref Range Status   Result 1 Comment  Final    Comment: (NOTE) KOH/Calcofluor preparation:  no fungus observed. Performed At: [facility] [place], [place] [id number] Rush Farmer MD [id number]   SARS Coronavirus 2 by RT PCR (hospital order, performed in [facility] hospital lab) Nasopharyngeal Nasopharyngeal Swab     Status: None   Collection Time: 09/01/19  5:23 PM   Specimen: Nasopharyngeal Swab  Result Value Ref Range Status   SARS Coronavirus 2 NEGATIVE NEGATIVE Final    Comment: (NOTE) SARS-CoV-2 target nucleic acids are NOT DETECTED. The SARS-CoV-2 RNA is generally detectable in upper and lower respiratory specimens during the acute phase of infection. The lowest concentration of SARS-CoV-2 viral copies  this assay can detect is 250 copies / mL. A negative result does not  preclude SARS-CoV-2 infection and should not be used as the sole basis for treatment or other patient management decisions.  A negative result may occur with improper specimen collection / handling, submission of specimen other than nasopharyngeal swab, presence of viral mutation(s) within the areas targeted by this assay, and inadequate number of viral copies (<250 copies / mL). A negative result must be combined with clinical observations, patient history, and epidemiological information. Fact Sheet for Patients:   StrictlyIdeas.no Fact Sheet for Healthcare Providers: BankingDealers.co.za This test is not yet approved or cleared  by the [place] FDA and has been authorized for detection and/or diagnosis of SARS-CoV-2 by FDA under an Emergency Use Authorization (EUA).  This EUA will remain in effect (meaning this test can be used) for the duration of the COVID-19 declaration under Section 564(b)(1) of the Act, 21 U.S.C. section 360bbb-3(b)(1), unless the authorization is terminated or revoked sooner. Performed at [facility], [place] [address], [place], [place] [address]    Time spent: 30 min  SIGNED:   Marylu Lund, MD  [facility] 09/09/2019, 10:06 AM  If 7PM-7AM, please contact night-coverage

## 2019-09-09 NOTE — Plan of Care (Signed)
Continue to monitor

## 2019-09-09 NOTE — Plan of Care (Signed)
Discharge to home °

## 2019-09-09 NOTE — Treatment Plan (Signed)
Had discussed with pharmacy on 6/3 regarding culture results from [facility]. Based on cultures, pt had been continued on Unasyn. At discharge, would recommend Augmentin to complete course. CTS following, pending follow up AP/Lat CXR

## 2019-09-13 ENCOUNTER — Other Ambulatory Visit: Payer: Self-pay | Admitting: Cardiothoracic Surgery

## 2019-09-13 DIAGNOSIS — J869 Pyothorax without fistula: Secondary | ICD-10-CM

## 2019-09-14 ENCOUNTER — Ambulatory Visit (INDEPENDENT_AMBULATORY_CARE_PROVIDER_SITE_OTHER): Payer: Self-pay | Admitting: Cardiothoracic Surgery

## 2019-09-14 ENCOUNTER — Other Ambulatory Visit: Payer: Self-pay

## 2019-09-14 ENCOUNTER — Ambulatory Visit
Admission: RE | Admit: 2019-09-14 | Discharge: 2019-09-14 | Disposition: A | Discharge status: Home / Self Care | Payer: No Typology Code available for payment source | Source: Ambulatory Visit | Attending: Cardiothoracic Surgery | Admitting: Cardiothoracic Surgery

## 2019-09-14 ENCOUNTER — Encounter: Payer: Self-pay | Admitting: Cardiothoracic Surgery

## 2019-09-14 DIAGNOSIS — Z09 Encounter for follow-up examination after completed treatment for conditions other than malignant neoplasm: Secondary | ICD-10-CM

## 2019-09-14 DIAGNOSIS — J869 Pyothorax without fistula: Secondary | ICD-10-CM

## 2019-09-14 MED ORDER — AMOXICILLIN-POT CLAVULANATE 875-125 MG PO TABS: 1.0000 | ORAL_TABLET | Freq: Two times a day (BID) | ORAL | 0 refills | Status: AC

## 2019-09-14 NOTE — Progress Notes (Signed)
PCP is Laurey Morale, MD Referring Provider is Damita Lack, MD  Chief Complaint  Patient presents with  . Routine Post Op    s/p R VATS/decortication, drainage of empyema on 09/01/19    HPI: Patient returns for his first postop visit after VATS for severe purulent right empyema drainage and decortication right lower lobe.  His chest tubes are all out.  Chest x-ray today shows minimal effusion-pleural thickening with good reexpansion of the right lung and small lateral space.  The VATS incision is healing well and skin staples have been removed.  The patient is on oral Augmentin twice daily for 2 weeks.  Cultures of the purulent fluid were nonrevealing.  His pain is fairly well controlled. He is breathing comfortably on room air. He is moving well and appears fairly strong despite his depleted nutritional status and cachectic body habitus.  Past Medical History:  Diagnosis Date  . Cancer (HCC)    tongue and neck  . Hepatitis C     Past Surgical History:  Procedure Laterality Date  . CENTRAL VENOUS CATHETER INSERTION Left 09/01/2019   Procedure: Insertion Central Line Adult;  Surgeon: Prescott Gum, Collier Salina, MD;  Location: [place] [facility];  Service: Thoracic;  Laterality: Left;  . OTHER SURGICAL HISTORY  06/01/2012   part of tongue removed and reconstructed  . VIDEO ASSISTED THORACOSCOPY (VATS)/EMPYEMA Right 09/01/2019   Procedure: VIDEO ASSISTED THORACOSCOPY (VATS)/EMPYEMA  AND DECORTICATION;  Surgeon: Ivin Poot, MD;  Location: [facility] OR;  Service: Thoracic;  Laterality: Right;    Family History  Problem Relation Age of Onset  . Pancreatic cancer Mother   . Diabetes Father   . CAD Father     Social History Social History   Tobacco Use  . Smoking status: Former Research scientist (life sciences)  . Smokeless tobacco: Never Used  Substance Use Topics  . Alcohol use: No  . Drug use: Not Currently    Types: Cocaine    Current Outpatient Medications  Medication Sig Dispense Refill  . Alum & Mag  Hydroxide-Simeth (GI COCKTAIL) SUSP suspension Swish and spit 5 mLs 4 (four) times daily as needed (for pain). Shake well.    . Alum & Mag Hydroxide-Simeth (MAGIC MOUTHWASH) SOLN Swish and spit 10 mLs 4 (four) times daily as needed (for thrush).    Marland Kitchen amoxicillin-clavulanate (AUGMENTIN) 875-125 MG tablet Place 1 tablet into feeding tube 2 (two) times daily for 14 days. 28 tablet 0  . ferrous sulfate 300 (60 Fe) MG/5ML syrup Place 5 mLs (300 mg total) into feeding tube daily with breakfast. 150 mL 3  . metFORMIN (GLUCOPHAGE) 500 MG tablet Place 1 tablet (500 mg total) into feeding tube 2 (two) times daily with a meal. 60 tablet 0  . Nutritional Supplements (FEEDING SUPPLEMENT, OSMOLITE 1.5 CAL,) LIQD Place 474 mLs into feeding tube 4 (four) times daily. 1000 mL 0  . ondansetron (ZOFRAN) 4 MG tablet Take 1 tablet (4 mg total) by mouth every 6 (six) hours. 20 tablet 0  . oxyCODONE (OXY IR/ROXICODONE) 5 MG immediate release tablet Place 1 tablet (5 mg total) into feeding tube every 4 (four) hours as needed for moderate pain. 20 tablet 0  . prochlorperazine (COMPAZINE) 10 MG tablet Give 10 mg by tube every 6 (six) hours as needed (for nausea/vomiting).     No current facility-administered medications for this visit.    Allergies  Allergen Reactions  . Codeine     REACTION: vomitting    Review of Systems  Recovering well  from his VATS  BP 136/85 (BP Location: Right Arm, Patient Position: Sitting, Cuff Size: Normal)   Pulse (!) 115 Comment: regular  Temp (!) 96.8 F (36 C) (Skin)   Resp 20   Ht 5\' 9"  (1.753 m)   Wt 128 lb (58.1 kg)   SpO2 94% Comment: RA  BMI 18.90 kg/m  Physical Exam      Exam    General- alert and comfortable.  Right VATS incision healing well.    Neck- no JVD, no cervical adenopathy palpable, no carotid bruit   Lungs-breath sounds right base slightly decreased but clear without rales, wheezes   Cor- regular rate and rhythm, no murmur , gallop   Abdomen- soft,  non-tender   Extremities - warm, non-tender, minimal edema   Neuro- oriented, appropriate, no focal weakness   Diagnostic Tests: Chest x-ray taken today is personally reviewed with results as noted above  Impression: Recovering from right VATS for empyema. Complete 2 weeks of empiric oral Augmentin.  Return in 3 weeks with chest x-ray to assess progress. Plan: Return July 7 with chest x-ray.  Len Childs, MD [facility] [phone]

## 2019-09-14 NOTE — Progress Notes (Signed)
Pt w/ three separate incisions to R upper back/lateral chest, all of which are c/d/i and well-approximated w/ sutures. Ten sutures easily removed per standard protocol from uppermost incision. Total of four other sutures are easily removed from two lower incisions. Pt instructed to keep sites clean (w/ mild soap and water) and dry and to report s/s of infection.

## 2019-09-30 LAB — FUNGUS CULTURE WITH STAIN

## 2019-09-30 LAB — FUNGAL ORGANISM REFLEX

## 2019-09-30 LAB — FUNGUS CULTURE RESULT

## 2019-10-05 ENCOUNTER — Ambulatory Visit: Payer: No Typology Code available for payment source | Admitting: Cardiothoracic Surgery

## 2019-10-11 ENCOUNTER — Other Ambulatory Visit: Payer: Self-pay | Admitting: Cardiothoracic Surgery

## 2019-10-11 DIAGNOSIS — J869 Pyothorax without fistula: Secondary | ICD-10-CM

## 2019-10-12 ENCOUNTER — Ambulatory Visit: Payer: No Typology Code available for payment source | Admitting: Cardiothoracic Surgery

## 2019-10-12 ENCOUNTER — Ambulatory Visit (INDEPENDENT_AMBULATORY_CARE_PROVIDER_SITE_OTHER): Payer: Self-pay | Admitting: Cardiothoracic Surgery

## 2019-10-12 ENCOUNTER — Encounter: Payer: Self-pay | Admitting: Cardiothoracic Surgery

## 2019-10-12 ENCOUNTER — Ambulatory Visit
Admission: RE | Admit: 2019-10-12 | Discharge: 2019-10-12 | Disposition: A | Discharge status: Home / Self Care | Payer: No Typology Code available for payment source | Source: Ambulatory Visit | Attending: Cardiothoracic Surgery | Admitting: Cardiothoracic Surgery

## 2019-10-12 ENCOUNTER — Other Ambulatory Visit: Payer: Self-pay

## 2019-10-12 VITALS — BP 112/74 | HR 110 | Temp 97.6°F | Resp 16 | Ht 69.0 in | Wt 121.8 lb

## 2019-10-12 DIAGNOSIS — J869 Pyothorax without fistula: Secondary | ICD-10-CM

## 2019-10-12 DIAGNOSIS — E43 Unspecified severe protein-calorie malnutrition: Secondary | ICD-10-CM

## 2019-10-12 DIAGNOSIS — Z8709 Personal history of other diseases of the respiratory system: Secondary | ICD-10-CM

## 2019-10-12 MED ORDER — TRAMADOL HCL 50 MG PO TABS: 50.0000 mg | ORAL_TABLET | Freq: Four times a day (QID) | ORAL | 0 refills | Status: AC | PRN

## 2019-10-12 NOTE — Progress Notes (Signed)
PCP is Laurey Morale, MD Referring Provider is Damita Lack, MD  Chief Complaint  Patient presents with  . Routine Post Op    1 month f/u with CXR    HPI: Final postop visit 2 months after right VATS, drainage of right empyema and decortication right lower lobe.  He has finished his oral antibiotics.  His chest x-ray taken today is personally reviewed which shows clearing of the empyema and good aeration of the right lower lobe with some residual minimal pleural thickening. \ He denies shortness of breath.  He is on tube feeds Osmolite 7 boxes/day which he will supplement with boost to help gain weight.  He is having some pain in his oxycodone has run out.  I will give him one prescription for tramadol.   Past Medical History:  Diagnosis Date  . Cancer (HCC)    tongue and neck  . Hepatitis C     Past Surgical History:  Procedure Laterality Date  . CENTRAL VENOUS CATHETER INSERTION Left 09/01/2019   Procedure: Insertion Central Line Adult;  Surgeon: Prescott Gum, Collier Salina, MD;  Location: [facility];  Service: Thoracic;  Laterality: Left;  . OTHER SURGICAL HISTORY  06/01/2012   part of tongue removed and reconstructed  . VIDEO ASSISTED THORACOSCOPY (VATS)/EMPYEMA Right 09/01/2019   Procedure: VIDEO ASSISTED THORACOSCOPY (VATS)/EMPYEMA  AND DECORTICATION;  Surgeon: Ivin Poot, MD;  Location: [facility] OR;  Service: Thoracic;  Laterality: Right;    Family History  Problem Relation Age of Onset  . Pancreatic cancer Mother   . Diabetes Father   . CAD Father     Social History Social History   Tobacco Use  . Smoking status: Former Research scientist (life sciences)  . Smokeless tobacco: Never Used  Substance Use Topics  . Alcohol use: No  . Drug use: Not Currently    Types: Cocaine    Current Outpatient Medications  Medication Sig Dispense Refill  . Alum & Mag Hydroxide-Simeth (GI COCKTAIL) SUSP suspension Swish and spit 5 mLs 4 (four) times daily as needed (for pain). Shake well.    . Alum & Mag  Hydroxide-Simeth (MAGIC MOUTHWASH) SOLN Swish and spit 10 mLs 4 (four) times daily as needed (for thrush).    . ferrous sulfate 300 (60 Fe) MG/5ML syrup Place 5 mLs (300 mg total) into feeding tube daily with breakfast. 150 mL 3  . metFORMIN (GLUCOPHAGE) 500 MG tablet Place 1 tablet (500 mg total) into feeding tube 2 (two) times daily with a meal. 60 tablet 0  . Nutritional Supplements (FEEDING SUPPLEMENT, OSMOLITE 1.5 CAL,) LIQD Place 474 mLs into feeding tube 4 (four) times daily. 1000 mL 0  . ondansetron (ZOFRAN) 4 MG tablet Take 1 tablet (4 mg total) by mouth every 6 (six) hours. 20 tablet 0  . oxyCODONE (OXY IR/ROXICODONE) 5 MG immediate release tablet Place 1 tablet (5 mg total) into feeding tube every 4 (four) hours as needed for moderate pain. 20 tablet 0  . prochlorperazine (COMPAZINE) 10 MG tablet Give 10 mg by tube every 6 (six) hours as needed (for nausea/vomiting).     No current facility-administered medications for this visit.    Allergies  Allergen Reactions  . Codeine     REACTION: vomitting    Review of Systems  Not able to gain weight No fever No shortness of breath No falls  BP 112/74 (BP Location: Left Arm, Patient Position: Sitting, Cuff Size: Normal)   Pulse (!) 110   Temp 97.6 F (36.4 C)  Resp 16   Ht 5\' 9"  (1.753 m)   Wt 121 lb 12.8 oz (55.2 kg)   SpO2 95% Comment: RA  BMI 17.99 kg/m  Physical Exam      Exam    General- alert and comfortable.  Right VATS incision well-healed.    Neck- no JVD, no cervical adenopathy palpable, no carotid bruit   Lungs- clear without rales, wheezes   Cor- regular rate and rhythm, no murmur , gallop   Abdomen- soft, non-tender   Extremities - warm, non-tender, minimal edema   Neuro- oriented, appropriate, no focal weakness   Diagnostic Tests:  Chest x-ray performed today personally reviewed with results as noted above-clear Impression: Patient has recovered well from right VATS for empyema.  No further x-rays  or scans are needed.  No further antibiotics are needed.  He is encouraged to get a influenza vaccine every fall.  A prescription for tramadol has been sent for some residual postthoracotomy pain  Plan: Return as needed   Len Childs, MD [facility] [phone]

## 2019-10-15 LAB — ACID FAST CULTURE WITH REFLEXED SENSITIVITIES (MYCOBACTERIA): Acid Fast Culture: NEGATIVE

## 2019-10-26 ENCOUNTER — Telehealth: Payer: Self-pay | Admitting: *Deleted

## 2019-10-26 ENCOUNTER — Other Ambulatory Visit: Payer: Self-pay | Admitting: *Deleted

## 2019-10-26 DIAGNOSIS — J869 Pyothorax without fistula: Secondary | ICD-10-CM

## 2019-10-26 NOTE — Telephone Encounter (Signed)
A representative from a medical facility called requesting that Brandon Miles be seen back in our office for probable recurrent right pl effusion.  He was just seen on 10/12/19 post op for treatment of empyema.  A chest xray and blood work have been done by the facility and findings will be faxed to [place].   They report he is congested and more short of breath, but afebrile.  I said that Dr, Prescott Gum was on vacation, but that a PA could see him tomorrow.  Our office will call in the morning when that time has been arranged.  These arrangements were agreed upon.

## 2019-10-27 ENCOUNTER — Encounter: Payer: Self-pay | Admitting: Physician Assistant

## 2019-10-27 ENCOUNTER — Ambulatory Visit (INDEPENDENT_AMBULATORY_CARE_PROVIDER_SITE_OTHER): Payer: Self-pay | Admitting: Physician Assistant

## 2019-10-27 ENCOUNTER — Ambulatory Visit
Admission: RE | Admit: 2019-10-27 | Discharge: 2019-10-27 | Disposition: A | Discharge status: Home / Self Care | Payer: No Typology Code available for payment source | Source: Ambulatory Visit | Attending: Cardiothoracic Surgery | Admitting: Cardiothoracic Surgery

## 2019-10-27 ENCOUNTER — Encounter (HOSPITAL_COMMUNITY): Payer: Self-pay

## 2019-10-27 ENCOUNTER — Emergency Department (HOSPITAL_COMMUNITY): Payer: No Typology Code available for payment source

## 2019-10-27 ENCOUNTER — Telehealth: Payer: Self-pay

## 2019-10-27 ENCOUNTER — Observation Stay (HOSPITAL_COMMUNITY)
Admission: EM | Admit: 2019-10-27 | Discharge: 2019-10-28 | Disposition: A | Discharge status: Home / Self Care | Payer: No Typology Code available for payment source | Attending: Internal Medicine | Admitting: Internal Medicine

## 2019-10-27 ENCOUNTER — Other Ambulatory Visit: Payer: Self-pay

## 2019-10-27 VITALS — BP 109/67 | HR 130 | Temp 97.6°F | Resp 20 | Ht 69.0 in | Wt 126.8 lb

## 2019-10-27 DIAGNOSIS — Z7984 Long term (current) use of oral hypoglycemic drugs: Secondary | ICD-10-CM | POA: Diagnosis not present

## 2019-10-27 DIAGNOSIS — J189 Pneumonia, unspecified organism: Secondary | ICD-10-CM | POA: Diagnosis not present

## 2019-10-27 DIAGNOSIS — Z87891 Personal history of nicotine dependence: Secondary | ICD-10-CM | POA: Diagnosis not present

## 2019-10-27 DIAGNOSIS — R05 Cough: Secondary | ICD-10-CM | POA: Diagnosis present

## 2019-10-27 DIAGNOSIS — Z20822 Contact with and (suspected) exposure to covid-19: Secondary | ICD-10-CM | POA: Diagnosis not present

## 2019-10-27 DIAGNOSIS — Z85810 Personal history of malignant neoplasm of tongue: Secondary | ICD-10-CM | POA: Insufficient documentation

## 2019-10-27 DIAGNOSIS — E1165 Type 2 diabetes mellitus with hyperglycemia: Secondary | ICD-10-CM | POA: Diagnosis not present

## 2019-10-27 DIAGNOSIS — J869 Pyothorax without fistula: Secondary | ICD-10-CM

## 2019-10-27 DIAGNOSIS — R791 Abnormal coagulation profile: Secondary | ICD-10-CM | POA: Insufficient documentation

## 2019-10-27 LAB — CBC WITH DIFFERENTIAL/PLATELET
Abs Immature Granulocytes: 0.09 K/uL — ABNORMAL HIGH (ref 0.00–0.07)
Basophils Absolute: 0.1 K/uL (ref 0.0–0.1)
Basophils Relative: 1 %
Eosinophils Absolute: 0 K/uL (ref 0.0–0.5)
Eosinophils Relative: 0 %
HCT: 45.3 % (ref 39.0–52.0)
Hemoglobin: 13.8 g/dL (ref 13.0–17.0)
Immature Granulocytes: 1 %
Lymphocytes Relative: 7 %
Lymphs Abs: 0.7 K/uL (ref 0.7–4.0)
MCH: 29.2 pg (ref 26.0–34.0)
MCHC: 30.5 g/dL (ref 30.0–36.0)
MCV: 96 fL (ref 80.0–100.0)
Monocytes Absolute: 0.8 K/uL (ref 0.1–1.0)
Monocytes Relative: 8 %
Neutro Abs: 8.5 K/uL — ABNORMAL HIGH (ref 1.7–7.7)
Neutrophils Relative %: 83 %
Platelets: 395 K/uL (ref 150–400)
RBC: 4.72 MIL/uL (ref 4.22–5.81)
RDW: 14.8 % (ref 11.5–15.5)
WBC: 10.1 K/uL (ref 4.0–10.5)
nRBC: 0 % (ref 0.0–0.2)

## 2019-10-27 LAB — URINALYSIS, ROUTINE W REFLEX MICROSCOPIC
Bilirubin Urine: NEGATIVE
Glucose, UA: 50 mg/dL — AB
Hgb urine dipstick: NEGATIVE
Ketones, ur: NEGATIVE mg/dL
Leukocytes,Ua: NEGATIVE
Nitrite: NEGATIVE
Protein, ur: NEGATIVE mg/dL
Specific Gravity, Urine: 1.028 (ref 1.005–1.030)
pH: 7 (ref 5.0–8.0)

## 2019-10-27 LAB — COMPREHENSIVE METABOLIC PANEL WITH GFR
ALT: 32 U/L (ref 0–44)
AST: 34 U/L (ref 15–41)
Albumin: 2.8 g/dL — ABNORMAL LOW (ref 3.5–5.0)
Alkaline Phosphatase: 102 U/L (ref 38–126)
Anion gap: 10 (ref 5–15)
BUN: 18 mg/dL (ref 8–23)
CO2: 29 mmol/L (ref 22–32)
Calcium: 9 mg/dL (ref 8.9–10.3)
Chloride: 97 mmol/L — ABNORMAL LOW (ref 98–111)
Creatinine, Ser: 0.55 mg/dL — ABNORMAL LOW (ref 0.61–1.24)
GFR calc Af Amer: >60 mL/min
GFR calc non Af Amer: >60 mL/min
Glucose, Bld: 129 mg/dL — ABNORMAL HIGH (ref 70–99)
Potassium: 4.6 mmol/L (ref 3.5–5.1)
Sodium: 136 mmol/L (ref 135–145)
Total Bilirubin: 0.3 mg/dL (ref 0.3–1.2)
Total Protein: 7.5 g/dL (ref 6.5–8.1)

## 2019-10-27 LAB — LACTIC ACID, PLASMA
Lactic Acid, Venous: 2.3 mmol/L (ref 0.5–1.9)
Lactic Acid, Venous: 0.9 mmol/L (ref 0.5–1.9)

## 2019-10-27 LAB — SARS CORONAVIRUS 2 BY RT PCR (HOSPITAL ORDER, PERFORMED IN ~~LOC~~ HOSPITAL LAB): SARS Coronavirus 2: NEGATIVE

## 2019-10-27 LAB — PROTIME-INR
INR: 0.9 (ref 0.8–1.2)
Prothrombin Time: 12.2 seconds (ref 11.4–15.2)

## 2019-10-27 MED ORDER — PIPERACILLIN-TAZOBACTAM 3.375 G IVPB 30 MIN
3.3750 g | Freq: Once | INTRAVENOUS | Status: AC
  Administered 2019-10-27: 3.375 g via INTRAVENOUS
  Filled 2019-10-27: qty 50

## 2019-10-27 MED ORDER — SODIUM CHLORIDE 0.9 % IV BOLUS
1000.0000 mL | Freq: Once | INTRAVENOUS | Status: AC
  Administered 2019-10-27: 1000 mL via INTRAVENOUS

## 2019-10-27 MED ORDER — ENOXAPARIN SODIUM 40 MG/0.4ML ~~LOC~~ SOLN
40.0000 mg | SUBCUTANEOUS | Status: DC
  Administered 2019-10-28: 40 mg via SUBCUTANEOUS
  Filled 2019-10-27: qty 0.4

## 2019-10-27 MED ORDER — IOHEXOL 350 MG/ML SOLN
75.0000 mL | Freq: Once | INTRAVENOUS | Status: AC | PRN
  Administered 2019-10-27: 75 mL via INTRAVENOUS

## 2019-10-27 MED ORDER — SODIUM CHLORIDE 0.9 % IV BOLUS: 1000.0000 mL | Freq: Once | INTRAVENOUS | Status: DC

## 2019-10-27 NOTE — Telephone Encounter (Signed)
Spoke with patient's wife, patient was made an appointment with PA's today with chest xray, 7/22 for worsening shortness of breath and congestion.  Spoke with Dory Larsen from [facility] in [place] [facility] to make her aware.  Lorie also stated she would send lab work that was just done as well from the clinic.

## 2019-10-27 NOTE — ED Provider Notes (Signed)
[facility] EMERGENCY DEPARTMENT Provider Note   CSN: [id number] Arrival date & time: 10/27/19  1344     History Chief Complaint  Patient presents with  . Cough    Brandon Miles is a 69 y.o. male.  The history is provided by the patient.  Cough Cough characteristics:  Productive Sputum characteristics:  Nondescript Severity:  Mild Onset quality:  Gradual Timing:  Constant Progression:  Unchanged Chronicity:  New Context: upper respiratory infection (recent pneumonia and VATS procedure by CT surgery. Sent for evaluation for lung infection)   Relieved by:  Nothing Worsened by:  Nothing Associated symptoms: no chest pain, no chills, no ear pain, no fever, no rash, no shortness of breath and no sore throat   Risk factors: recent infection        Past Medical History:  Diagnosis Date  . Cancer (HCC)    tongue and neck  . Hepatitis C     Patient Active Problem List   Diagnosis Date Noted  . History of pleural empyema 10/12/2019  . Postop check 09/14/2019  . Hepatitis C 09/01/2019  . Uncontrolled type 2 diabetes mellitus with hyperglycemia, without long-term current use of insulin ([place]) 09/01/2019  . Hyponatremia 09/01/2019  . Severe protein-calorie malnutrition ([place]) 09/01/2019  . Empyema, right ([place]) 09/01/2019  . Empyema of right pleural space ([place]) 08/31/2019  . CANDIDIASIS, GLANS PENIS 03/28/2008  . PARONYCHIA, FINGER 10/26/2006    Past Surgical History:  Procedure Laterality Date  . CENTRAL VENOUS CATHETER INSERTION Left 09/01/2019   Procedure: Insertion Central Line Adult;  Surgeon: Prescott Gum, Collier Salina, MD;  Location: [facility];  Service: Thoracic;  Laterality: Left;  . OTHER SURGICAL HISTORY  06/01/2012   part of tongue removed and reconstructed  . VIDEO ASSISTED THORACOSCOPY (VATS)/EMPYEMA Right 09/01/2019   Procedure: VIDEO ASSISTED THORACOSCOPY (VATS)/EMPYEMA  AND DECORTICATION;  Surgeon: Ivin Poot, MD;  Location: [facility] OR;  Service:  Thoracic;  Laterality: Right;       Family History  Problem Relation Age of Onset  . Pancreatic cancer Mother   . Diabetes Father   . CAD Father     Social History   Tobacco Use  . Smoking status: Former Research scientist (life sciences)  . Smokeless tobacco: Never Used  Substance Use Topics  . Alcohol use: No  . Drug use: Not Currently    Types: Cocaine    Home Medications Prior to Admission medications   Medication Sig Start Date End Date Taking? Authorizing Provider  esomeprazole (NEXIUM) 20 MG packet Take 20 mg by mouth daily before breakfast. Per tube   Yes [provider]  ferrous sulfate 300 (60 Fe) MG/5ML syrup Place 5 mLs (300 mg total) into feeding tube daily with breakfast. 09/09/19  Yes Donne Hazel, MD  Glucosamine-Chondroitin 1500-1200 MG/30ML LIQD Place 15 mLs into feeding tube daily.   Yes [provider]  ipratropium-albuterol (DUONEB) 0.5-2.5 (3) MG/3ML SOLN Take 3 mLs by nebulization daily as needed (for wheezing or shortness of breath).   Yes [provider]  Iron-Vitamins (GERITOL) LIQD Place 15 mLs into feeding tube daily.   Yes [provider]  metFORMIN (GLUCOPHAGE) 500 MG tablet Place 1 tablet (500 mg total) into feeding tube 2 (two) times daily with a meal. 09/09/19 10/27/19 Yes Donne Hazel, MD  Nutritional Supplements (FEEDING SUPPLEMENT, OSMOLITE 1.5 CAL,) LIQD Place 474 mLs into feeding tube 4 (four) times daily. 09/09/19  Yes Donne Hazel, MD  ondansetron (ZOFRAN) 4 MG tablet Take  1 tablet (4 mg total) by mouth every 6 (six) hours. Patient taking differently: Place 4 mg into feeding tube every 6 (six) hours.  08/14/12  Yes Delo, Nathaneil Canary, MD  Phenylephrine-DM-GG (EQL MUCUS RELIEF CHILDRENS PO) Place 10 mLs into feeding tube 2 (two) times daily as needed (for nasal congestion).   Yes [provider]  prochlorperazine (COMPAZINE) 10 MG tablet Give 10 mg by tube every 6 (six) hours as needed for nausea or vomiting.    Yes [provider]  traMADol (ULTRAM) 50 MG tablet Take 1 tablet (50 mg total) by mouth every 6 (six) hours as needed. Patient taking differently: Place 50 mg into feeding tube every 6 (six) hours as needed for moderate pain.  10/12/19  Yes Ivin Poot, MD  acetaminophen-codeine 120-12 MG/5ML solution Place 10 mLs into feeding tube 3 (three) times daily as needed for moderate pain.  10/17/19   [provider]  Alum & Mag Hydroxide-Simeth (MAGIC MOUTHWASH) SOLN Swish and spit 10 mLs 4 (four) times daily as needed (for thrush).     [provider]  oxyCODONE (OXY IR/ROXICODONE) 5 MG immediate release tablet Place 1 tablet (5 mg total) into feeding tube every 4 (four) hours as needed for moderate pain. Patient not taking: Reported on 10/27/2019 09/09/19   Donne Hazel, MD    Allergies    Codeine  Review of Systems   Review of Systems  Constitutional: Negative for chills and fever.  HENT: Negative for ear pain and sore throat.   Eyes: Negative for pain and visual disturbance.  Respiratory: Positive for cough. Negative for shortness of breath.   Cardiovascular: Negative for chest pain and palpitations.  Gastrointestinal: Negative for abdominal pain and vomiting.  Genitourinary: Negative for dysuria and hematuria.  Musculoskeletal: Negative for arthralgias and back pain.  Skin: Negative for color change and rash.  Neurological: Negative for seizures and syncope.  All other systems reviewed and are negative.   Physical Exam Updated Vital Signs  ED Triage Vitals  Enc Vitals Group     BP 10/27/19 1350 109/84     Pulse Rate 10/27/19 1350 (!) 119     Resp 10/27/19 1350 20     Temp 10/27/19 1350 (!) 97 F (36.1 C)     Temp Source 10/27/19 1350 Axillary     SpO2 10/27/19 1350 92 %     Weight 10/27/19 1350 126 lb (57.2 kg)     Height 10/27/19 1350 5\' 9"  (1.753 m)     Head Circumference --      Peak Flow --      Pain Score 10/27/19 1406 0     Pain Loc --      Pain Edu?  --      Excl. in [place]? --     Physical Exam Vitals and nursing note reviewed.  Constitutional:      General: He is not in acute distress.    Appearance: He is well-developed. He is not ill-appearing.  HENT:     Head: Normocephalic and atraumatic.     Nose: Nose normal.     Mouth/Throat:     Mouth: Mucous membranes are moist.  Eyes:     Extraocular Movements: Extraocular movements intact.     Conjunctiva/sclera: Conjunctivae normal.     Pupils: Pupils are equal, round, and reactive to light.  Cardiovascular:     Rate and Rhythm: Normal rate and regular rhythm.     Pulses: Normal pulses.  Heart sounds: Normal heart sounds. No murmur heard.   Pulmonary:     Effort: Pulmonary effort is normal. No respiratory distress.     Breath sounds: Normal breath sounds.  Abdominal:     Palpations: Abdomen is soft.     Tenderness: There is no abdominal tenderness.  Musculoskeletal:     Cervical back: Normal range of motion and neck supple.  Skin:    General: Skin is warm and dry.     Capillary Refill: Capillary refill takes less than 2 seconds.  Neurological:     General: No focal deficit present.     Mental Status: He is alert.  Psychiatric:        Mood and Affect: Mood normal.     ED Results / Procedures / Treatments   Labs (all labs ordered are listed, but only abnormal results are displayed) Labs Reviewed  COMPREHENSIVE METABOLIC PANEL - Abnormal; Notable for the following components:      Result Value   Chloride 97 (*)    Glucose, Bld 129 (*)    Creatinine, Ser 0.55 (*)    Albumin 2.8 (*)    All other components within normal limits  LACTIC ACID, PLASMA - Abnormal; Notable for the following components:   Lactic Acid, Venous 2.3 (*)    All other components within normal limits  CBC WITH DIFFERENTIAL/PLATELET - Abnormal; Notable for the following components:   Neutro Abs 8.5 (*)    Abs Immature Granulocytes 0.09 (*)    All other components within normal limits   URINALYSIS, ROUTINE W REFLEX MICROSCOPIC - Abnormal; Notable for the following components:   Glucose, UA 50 (*)    All other components within normal limits  CULTURE, BLOOD (ROUTINE X 2)  CULTURE, BLOOD (ROUTINE X 2)  SARS CORONAVIRUS 2 BY RT PCR (HOSPITAL ORDER, [place] [facility])  PROTIME-INR  LACTIC ACID, PLASMA    EKG EKG Interpretation  Date/Time:  Thursday October 27 2019 16:44:25 EDT Ventricular Rate:  101 PR Interval:    QRS Duration: 72 QT Interval:  335 QTC Calculation: 435 R Axis:   84 Text Interpretation: Sinus tachycardia Borderline right axis deviation Confirmed by Lennice Sites [phone]) on 10/27/2019 4:46:56 PM   Radiology DG Chest 2 View  Result Date: 10/27/2019 CLINICAL DATA:  Suspected sepsis EXAM: CHEST - 2 VIEW COMPARISON:  10/27/2019 at 1222 hours FINDINGS: Stable heart size. Unchanged elevation of the right hemidiaphragm with blunting of the right costophrenic angle. Chronic coarsened interstitial markings. No new airspace consolidation. No pneumothorax. IMPRESSION: No acute findings. No significant interval change from previous study. Electronically Signed   By: Davina Poke D.O.   On: 10/27/2019 14:53   DG Chest 2 View  Result Date: 10/27/2019 CLINICAL DATA:  History of right-sided VATS 09/01/2019 for empyema. Dyspnea and congestion. EXAM: CHEST - 2 VIEW COMPARISON:  10/25/2019 chest radiograph. FINDINGS: Stable cardiomediastinal silhouette with normal heart size. No pneumothorax. Stable mild elevation of the right hemidiaphragm. Stable blunting of the right costophrenic angle. No left pleural effusion. No pulmonary edema or acute consolidative airspace disease. Chronic mild distortion of the interstitial markings. IMPRESSION: 1. Stable blunting of the right costophrenic angle, favor pleural-parenchymal scarring with residual small right pleural effusion not excluded. 2. No acute cardiopulmonary disease. Electronically Signed   By: Ilona Sorrel M.D.   On: 10/27/2019 12:26   CT Angio Chest PE W and/or Wo Contrast  Result Date: 10/27/2019 CLINICAL DATA:  Chest pain, dyspnea, pleurisy, cough EXAM:  CT ANGIOGRAPHY CHEST WITH CONTRAST TECHNIQUE: Multidetector CT imaging of the chest was performed using the standard protocol during bolus administration of intravenous contrast. Multiplanar CT image reconstructions and MIPs were obtained to evaluate the vascular anatomy. CONTRAST:  25mL OMNIPAQUE IOHEXOL 350 MG/ML SOLN COMPARISON:  09/01/2019 FINDINGS: Cardiovascular: There is excellent opacification of the pulmonary arterial tree. No intraluminal filling defect is identified to suggest acute pulmonary embolism. The central pulmonary arteries are of normal caliber. Cardiac size is within normal limits. Mild left ventricular hypertrophy is again noted. Extensive coronary artery calcification is seen within all 3 major coronary arteries. No pericardial effusion. Thoracic aorta is of normal caliber. Mediastinum/Nodes: No pathologic thoracic adenopathy. Thyroid unremarkable. Lungs/Pleura: Previously noted right basilar pigtail catheter has been removed. Much of the fluid component previously noted has been evacuated, though small amount of residual loculated gas appears within the pleural space, likely representing a a pneumothorax ex vacuo. Marked adjacent pleural thickening is again identified. No significant residual fluid component. There is interval development of extensive multifocal pulmonary infiltrate with centrilobular and peribronchial nodularity identified most in keeping with atypical infection in the acute setting. There is associated mild bronchial wall thickening in keeping with airway inflammation. No pneumothorax. No central obstructing lesion. Upper Abdomen: Cholelithiasis noted. Musculoskeletal: No acute bone abnormality. Review of the MIP images confirms the above findings. IMPRESSION: No pulmonary embolism. Interval near complete  evacuation of the fluid component of the right empyema with removal of the right chest tube. Small residual loculated pneumothorax ex vacuo. Interval development of multifocal pulmonary infiltrates most in keeping with atypical infection. Associated airway inflammation. No central obstructing lesion. Extensive coronary artery calcification. Electronically Signed   By: Fidela Salisbury MD   On: 10/27/2019 18:35    Procedures Procedures (including critical care time)  Medications Ordered in ED Medications  piperacillin-tazobactam (ZOSYN) IVPB 3.375 g (3.375 g Intravenous New Bag/Given 10/27/19 2234)  sodium chloride 0.9 % bolus 1,000 mL (0 mLs Intravenous Stopped 10/27/19 1947)  iohexol (OMNIPAQUE) 350 MG/ML injection 75 mL (75 mLs Intravenous Contrast Given 10/27/19 1757)    ED Course  I have reviewed the triage vital signs and the nursing notes.  Pertinent labs & imaging results that were available during my care of the patient were reviewed by me and considered in my medical decision making (see chart for details).    MDM Rules/Calculators/A&P                          Brandon Miles is a 69 year old male with history of tongue cancer now with feeding tube who presents to the ED with cough.  Patient recently had VATS for lung infection.  Was on 2 weeks of antibiotics.  Followed up with cardiothoracic surgery today and was having some sputum production and sent for evaluation.  Patient mildly tachycardic upon arrival but upon my evaluation patient with heart rate in the low 100s.  Has cough and sputum production for 2 days.  Chest x-ray already done does not show any new significant findings.  No obvious new infiltrate.  Patient with no significant leukocytosis, anemia, electrolyte abnormality.  Will get a CT scan of his chest to further evaluate.  CT scan shows evidence of multifocal pneumonia.  Improved empyema and overall shows interval multifocal pneumonia.  Patient with negative Covid.   Lactic acid improved.  Low concern for sepsis but given complicated medical history and recent admission for pneumonia will bring him in for IV  antibiotics.  Admitted to medicine.  At this time tachycardia has improved.  No white count.  Does not meet sepsis criteria at this time however given his PMH believe admission needed. PORT score >100.  This chart was dictated using voice recognition software.  Despite best efforts to proofread,  errors can occur which can change the documentation meaning.     Final Clinical Impression(s) / ED Diagnoses Final diagnoses:  Multifocal pneumonia    Rx / DC Orders ED Discharge Orders    None       Lennice Sites, DO 10/27/19 2252

## 2019-10-27 NOTE — ED Triage Notes (Addendum)
Pt sent to ED by cardiothoracic surgery PA with concern of returned/ worsening PNA. Pt family reports he was here for lung sx that involved "scraping the lining" and a chest tube. PT reports increased productive cough with green sputum x 2 days

## 2019-10-27 NOTE — Progress Notes (Addendum)
  HPI:  Patient returns for routine postoperative follow-up having undergone a right VATS, for drainage of right empyema, decortication of right lower lobe, placement of left subclavian vein central line by Dr. Prescott Gum on 09/01/2019. Culture of the pleural fluid was positive for FUSOBACTERIUM NUCLEATUM  BETA LACTAMASE NEGATIVE. Patient was discharged by medicine on 09/09/2019 on Augmentin for 2 weeks, which he did take and finished prescription. Patient was last seen in follow up by Dr. Prescott Gum on 10/12/2019. At that time, he did have some residual post thoracotomy pain for which he was given more Ultram;otherwise, he was stable. Patient has not been feeling well the last several days. He continue to tolerate tube feeds. He has had sputum production, worsening shortness of breath (more so with exertion), dizziness, and chills at night. He denies nausea, abdominal pain, LE swelling or pain.  Current Outpatient Medications  Medication Sig Dispense Refill  . Alum & Mag Hydroxide-Simeth (GI COCKTAIL) SUSP suspension Swish and spit 5 mLs 4 (four) times daily as needed (for pain). Shake well.    . Alum & Mag Hydroxide-Simeth (MAGIC MOUTHWASH) SOLN Swish and spit 10 mLs 4 (four) times daily as needed (for thrush).    . ferrous sulfate 300 (60 Fe) MG/5ML syrup Place 5 mLs (300 mg total) into feeding tube daily with breakfast. 150 mL 3  . metFORMIN (GLUCOPHAGE) 500 MG tablet Place 1 tablet (500 mg total) into feeding tube 2 (two) times daily with a meal. 60 tablet 0  . Nutritional Supplements (FEEDING SUPPLEMENT, OSMOLITE 1.5 CAL,) LIQD Place 474 mLs into feeding tube 4 (four) times daily. 1000 mL 0  . ondansetron (ZOFRAN) 4 MG tablet Take 1 tablet (4 mg total) by mouth every 6 (six) hours. 20 tablet 0  . oxyCODONE (OXY IR/ROXICODONE) 5 MG immediate release tablet Place 1 tablet (5 mg total) into feeding tube every 4 (four) hours as needed for moderate pain. 20 tablet 0  . prochlorperazine (COMPAZINE)  10 MG tablet Give 10 mg by tube every 6 (six) hours as needed (for nausea/vomiting).    . traMADol (ULTRAM) 50 MG tablet Take 1 tablet (50 mg total) by mouth every 6 (six) hours as needed. 40 tablet 0  Vital Signs: BP 109/67, HR 130, RR 20, Oxygenation 94% on room air.   Physical Exam: CV-IRRR, tachycardic Pulmonary-Coarse on the right and clear on the left Extremities-No LE edema Wounds-Clean and dry  Diagnostic Tests: Patient had a PA and Lateral CXR done 10/26/2019: This showed a decrease in the right pleural effusion, left lung is clear. CBC done 10/26/2019 showed WBC 6200 ,H and H 12.9 and 39.4, platelets 349,000 D dimer 386  Impression and Plan: I am concerned about early infection, either recurrent pneumonia or perhaps another etiology. I have advised the patient and his wife to go to the ED for further work up (CT scan etc). Patient agreeable.    Nani Skillern, PA-C [facility] [phone]

## 2019-10-28 DIAGNOSIS — J188 Other pneumonia, unspecified organism: Secondary | ICD-10-CM | POA: Insufficient documentation

## 2019-10-28 DIAGNOSIS — J189 Pneumonia, unspecified organism: Secondary | ICD-10-CM | POA: Insufficient documentation

## 2019-10-28 LAB — CBC
HCT: 38.8 % — ABNORMAL LOW (ref 39.0–52.0)
Hemoglobin: 12.4 g/dL — ABNORMAL LOW (ref 13.0–17.0)
MCH: 29.6 pg (ref 26.0–34.0)
MCHC: 32 g/dL (ref 30.0–36.0)
MCV: 92.6 fL (ref 80.0–100.0)
Platelets: 371 10*3/uL (ref 150–400)
RBC: 4.19 MIL/uL — ABNORMAL LOW (ref 4.22–5.81)
RDW: 15 % (ref 11.5–15.5)
WBC: 4.1 10*3/uL (ref 4.0–10.5)
nRBC: 0 % (ref 0.0–0.2)

## 2019-10-28 LAB — CBG MONITORING, ED: Glucose-Capillary: 191 mg/dL — ABNORMAL HIGH (ref 70–99)

## 2019-10-28 LAB — PROCALCITONIN: Procalcitonin: <0.1 ng/mL

## 2019-10-28 MED ORDER — INSULIN ASPART 100 UNIT/ML ~~LOC~~ SOLN
0.0000 [IU] | Freq: Three times a day (TID) | SUBCUTANEOUS | Status: DC
  Administered 2019-10-28: 2 [IU] via SUBCUTANEOUS

## 2019-10-28 MED ORDER — DOXYCYCLINE HYCLATE 100 MG PO TABS
100.0000 mg | ORAL_TABLET | Freq: Two times a day (BID) | ORAL | 0 refills | Status: DC
Start: 2019-10-28 — End: 2019-10-28

## 2019-10-28 MED ORDER — DOXYCYCLINE HYCLATE 100 MG PO TABS
100.0000 mg | ORAL_TABLET | Freq: Two times a day (BID) | ORAL | 0 refills | Status: AC
Start: 2019-10-28 — End: 2019-11-01

## 2019-10-28 MED ORDER — PIPERACILLIN-TAZOBACTAM 3.375 G IVPB
3.3750 g | Freq: Three times a day (TID) | INTRAVENOUS | Status: DC
  Administered 2019-10-28: 3.375 g via INTRAVENOUS
  Filled 2019-10-28: qty 50

## 2019-10-28 MED ORDER — PANTOPRAZOLE SODIUM 40 MG PO PACK
40.0000 mg | PACK | Freq: Every day | ORAL | Status: DC
  Administered 2019-10-28: 40 mg
  Filled 2019-10-28 (×2): qty 20

## 2019-10-28 MED ORDER — OSMOLITE 1.5 CAL PO LIQD
474.0000 mL | Freq: Four times a day (QID) | ORAL | Status: DC
  Administered 2019-10-28: 474 mL
  Filled 2019-10-28: qty 474

## 2019-10-28 MED ORDER — FERROUS SULFATE 300 (60 FE) MG/5ML PO SYRP
300.0000 mg | ORAL_SOLUTION | Freq: Every day | ORAL | Status: DC
  Administered 2019-10-28: 300 mg
  Filled 2019-10-28 (×2): qty 5

## 2019-10-28 MED ORDER — INSULIN ASPART 100 UNIT/ML ~~LOC~~ SOLN: 0.0000 [IU] | Freq: Every day | SUBCUTANEOUS | Status: DC

## 2019-10-28 MED ORDER — OXYCODONE HCL 5 MG/5ML PO SOLN: 5.0000 mg | Freq: Four times a day (QID) | ORAL | Status: DC | PRN

## 2019-10-28 MED ORDER — IPRATROPIUM-ALBUTEROL 0.5-2.5 (3) MG/3ML IN SOLN: 3.0000 mL | Freq: Every day | RESPIRATORY_TRACT | Status: DC | PRN

## 2019-10-28 NOTE — Progress Notes (Signed)
Subjective:   Hospital day: 1  Overnight event: No  Patient is seen at bedside today. He is in no acute distress. His speech is hard to understand due to s/p resection of mucoepidermoid carcinoma of the tongue. He reports lots of sputum lately, why he came into the hospital. Wants to only stay one night (last night) for treatment. Says he feels good, no coughing or sputum production this morning. Denies CP, SOB, HA, abdominal pain. He states he can not have any PO intake.   Objective:  Vital signs in last 24 hours: Vitals:   10/28/19 0953 10/28/19 1000 10/28/19 1007 10/28/19 1033  BP:  (!) 149/88  (!) 149/88  Pulse:    100  Resp: 15 17 17 18   Temp:    98 F (36.7 C)  TempSrc:    Oral  SpO2:    97%  Weight:      Height:        Physical Exam  Physical Exam Constitutional:      General: He is not in acute distress.    Appearance: He is not toxic-appearing.     Comments: frail  HENT:     Head: Normocephalic.     Mouth/Throat:     Comments: Some dry blood noted on roof of mouth which patient contributes to the tongue tumor  Eyes:     General: No scleral icterus.    Conjunctiva/sclera: Conjunctivae normal.  Cardiovascular:     Rate and Rhythm: Normal rate and regular rhythm.     Heart sounds: Normal heart sounds. No murmur heard.   Pulmonary:     Effort: Pulmonary effort is normal. No respiratory distress.     Comments: Mild crackles noted Left lung base Abdominal:     General: Abdomen is flat.     Tenderness: There is no abdominal tenderness.  Musculoskeletal:        General: Normal range of motion.     Cervical back: Normal range of motion.     Right lower leg: No edema.     Left lower leg: No edema.  Neurological:     Mental Status: He is alert.  Psychiatric:        Mood and Affect: Mood normal.        Behavior: Behavior normal.        Thought Content: Thought content normal.    Lab CBC Latest Ref Rng & Units 10/28/2019 10/27/2019 09/08/2019  WBC 4.0 - 10.5  K/uL 4.1 10.1 5.9  Hemoglobin 13.0 - 17.0 g/dL 12.4(L) 13.8 10.4(L)  Hematocrit 39 - 52 % 38.8(L) 45.3 32.6(L)  Platelets 150 - 400 K/uL 371 395 386   CMP Latest Ref Rng & Units 10/27/2019 09/09/2019 09/08/2019  Glucose 70 - 99 mg/dL 129(H) 106(H) 142(H)  BUN 8 - 23 mg/dL 18 9 9   Creatinine 0.61 - 1.24 mg/dL 0.55(L) 0.42(L) 0.38(L)  Sodium 135 - 145 mmol/L 136 133(L) 133(L)  Potassium 3.5 - 5.1 mmol/L 4.6 4.3 4.4  Chloride 98 - 111 mmol/L 97(L) 94(L) 93(L)  CO2 22 - 32 mmol/L 29 33(H) 33(H)  Calcium 8.9 - 10.3 mg/dL 9.0 8.0(L) 7.9(L)  Total Protein 6.5 - 8.1 g/dL 7.5 - -  Total Bilirubin 0.3 - 1.2 mg/dL 0.3 - -  Alkaline Phos 38 - 126 U/L 102 - -  AST 15 - 41 U/L 34 - -  ALT 0 - 44 U/L 32 - -    Assessment/Plan: Brandon Miles is a 69 yo man  with PMH of significant for 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   Active Problems:   Pneumonia  CAP Patient with complaints of productive cough found to have radiologic evidence of multifocal pneumonia on CTA. He was recently admitted for Fusobacterium Nucleatum empyema s/p VATS and d/c with 2 weeks of Augmentin in May 2021. His COVID test is negative. PE was ruled out. Patient wanted to stay 1 night to receive IV antibiotics. He is clinically doing well. He has been afebrile, O2 sat well on RA, and without leukocytosis. Procal unremarkable. Other labs are also unremarkable. Patient is stable to be discharge today  - Patient will start taking Doxycycline 100 mg Q12h for 4 days per G tube. - Pending Legionella Urine Antigen and Strep Pneumo Urine Antigen and sputum culture - Patient will follow up with his PCP    Malnutrition Patient has history of tongue tumor s/p resection and G-tube placement. Patient can not take anything by mouth.  - Continue Osmolite  feed   Diet: Tube feed Osmolite IVF: No VTE: Lovenox CODE: Full  Prior to Admission Living Arrangement: home Anticipated Discharge Location: home Barriers to Discharge: no Dispo: Anticipated discharge in approximately 0 day(s).   Gaylan Gerold, DO 10/28/2019, 12:05 PM Pager: [phone] After 5pm on weekdays and 1pm on weekends: On Call pager [phone]

## 2019-10-28 NOTE — H&P (Signed)
Date: 10/28/2019               Patient Name:  Brandon Miles MRN: [id number]  DOB: 1950-10-12 Age / Sex: 69 y.o., male   PCP: Laurey Morale, MD         Medical Service: Internal Medicine Teaching Service         Attending Physician: Dr. Sid Falcon, MD    First Contact: Dr. Gaylan Gerold Pager: [phone]  Second Contact: Dr. Nathanial Rancher Pager: [phone]       After Hours (After 5p/  First Contact Pager: [phone]  weekends / holidays): Second Contact Pager: [phone]   Chief Complaint: Cough  History of Present Illness: Mr. 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  Approximately 2 months ago, patient presented to [facility] with shortness of breath and was found to have right empyema. He was transferred to [facility] for VATS and decortication. Cultures grew Fusobacterium Nucleatum for which he was treated with two weeks of oral Augmentin. Following this course, patient reports that he has otherwise been feeling well and has had no complaints. However, for the past two days he began to develop the onset of an isolated gradually worsening productive cough with green sputum production. He had a follow-up appointment with cardiothoracic surgery today who recommended that he come to the ED for evaluation. He has not experienced fevers, chills, shortness of breath, chest pain, myalgias, nausea, vomiting or diarrhea during this time.  ED Course:  On arrival to the ED, patient was tachycardic to 119, BP of 109/84, with resp rate of 20. He was afebrile and saturating >92% on room air. CBC, BMP, UA were unremarkable. COVID negative. Lactic acid mildly elevated at 2.3 and resolved to 0.9 after IVF. CXR showed no acute findings.  CTA revealed development of multifocal pulmonary infiltrates most in keeping with atypical infection. Patient was admitted to the Internal Medicine Teaching Service for continuation of IV antibiotics.   Meds:  Current Meds  Medication Sig  . esomeprazole (NEXIUM) 20 MG packet Take 20 mg by mouth daily before breakfast. Per tube  . ferrous sulfate 300 (60 Fe) MG/5ML syrup Place 5 mLs (300 mg total) into feeding tube daily with breakfast.  . Glucosamine-Chondroitin 1500-1200 MG/30ML LIQD Place 15 mLs into feeding tube daily.  Marland Kitchen ipratropium-albuterol (DUONEB) 0.5-2.5 (3) MG/3ML SOLN Take 3 mLs by nebulization daily as needed (for wheezing or shortness of breath).  . Iron-Vitamins (GERITOL) LIQD Place 15 mLs into feeding tube daily.  . metFORMIN (GLUCOPHAGE) 500 MG tablet Place 1 tablet (500 mg total) into feeding tube 2 (two) times daily with a meal.  . Nutritional Supplements (FEEDING SUPPLEMENT, OSMOLITE 1.5 CAL,) LIQD Place 474 mLs into feeding tube 4 (four) times daily.  . ondansetron (ZOFRAN) 4 MG tablet Take 1 tablet (4 mg total) by mouth every 6 (six) hours. (Patient taking differently: Place 4 mg into feeding tube every 6 (six) hours. )  . Phenylephrine-DM-GG (EQL MUCUS RELIEF CHILDRENS PO) Place 10 mLs into feeding tube 2 (two) times daily as needed (for nasal congestion).  . prochlorperazine (COMPAZINE) 10 MG tablet Give 10 mg by tube every 6 (six) hours as needed for nausea or vomiting.   . traMADol (ULTRAM) 50 MG tablet  Take 1 tablet (50 mg total) by mouth every 6 (six) hours as needed. (Patient taking differently: Place 50 mg into feeding tube every 6 (six) hours as needed for moderate pain. )   Allergies: Allergies as of 10/27/2019 - Review Complete 10/27/2019  Allergen Reaction Noted  . Codeine  10/26/2006   Past Medical History:  Diagnosis Date  . Cancer (HCC)    tongue and neck  . Hepatitis C    Family History:  Family History  Problem Relation Age of Onset  . Pancreatic  cancer Mother   . Diabetes Father   . CAD Father    Social History:  Lives with wife at home in [place], [place].  Former smoker No current alcohol use No current recreational drug use  Review of Systems: A complete ROS was negative except as per HPI.   Physical Exam: Blood pressure 115/74, pulse 94, temperature (!) 97 F (36.1 C), temperature source Axillary, resp. rate 19, height 5\' 9"  (1.753 m), weight 57.2 kg, SpO2 94 %.   Physical Exam Constitutional:      Appearance: Normal appearance.     Comments: Cachetic  HENT:     Head: Normocephalic and atraumatic.  Eyes:     Extraocular Movements: Extraocular movements intact.     Conjunctiva/sclera: Conjunctivae normal.  Cardiovascular:     Rate and Rhythm: Normal rate and regular rhythm.     Pulses: Normal pulses.     Heart sounds: Normal heart sounds.  Pulmonary:     Effort: Pulmonary effort is normal. No respiratory distress.     Breath sounds: Normal breath sounds.  Abdominal:     General: Abdomen is flat. Bowel sounds are normal.     Palpations: Abdomen is soft.     Comments: PEG tube in place  Musculoskeletal:        General: Normal range of motion.     Cervical back: Normal range of motion and neck supple.  Skin:    General: Skin is warm and dry.  Neurological:     General: No focal deficit present.     Mental Status: He is alert and oriented to person, place, and time. Mental status is at baseline.  Psychiatric:        Mood and Affect: Mood normal.        Behavior: Behavior normal.        Thought Content: Thought content normal.        Judgment: Judgment normal.    EKG: Sinus tachycardia; Borderline right axis deviation  CXR: No acute findings.  CTA Chest: No pulmonary embolism. Interval near complete evacuation of the fluid component of the right empyema with removal of the right chest tube. Small residual loculated pneumothorax ex vacuo. Interval development of multifocal pulmonary infiltrates most in  keeping with atypical infection. Associated airway inflammation. No central obstructing lesion. Extensive coronary artery calcification.  Assessment & Plan by Problem: Active Problems:   Pneumonia Mr. 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  #CAP Patient with complaints of productive cough found to have radiologic evidence of multifocal pneumonia. Patient afebrile, hemodynamically stable without evidence of hypoxia. No leukocytosis, negative COVID. Started on Zosyn in the ED given his recent complicated history of epyema in  the setting of cancer. CURB-65 of 1. -Zosyn per pharmacy dosing  -Continuous pulse oximetry  -CBC in AM -Legionella Urine Antigen -Procalcitonin -Strep Pneumo Urine Antigen  VTE ppx: Lovenox 40mg   Diet: Patient brought feeding supplement for consumption via PEG tube. IVF: none Code Status: Full  Dispo: Admit patient to Observation with expected length of stay less than 2 midnights.  Signed: Paulla Dolly, MD 10/28/2019, 12:07 AM  Pager: [phone] After 5pm on weekdays and 1pm on weekends: On Call pager: [phone]

## 2019-10-28 NOTE — Hospital Course (Signed)
-  Reports lots of sputum lately, why he came into the hospital. Wants to only stay one night (last night) for treatment. Says he feels good, no coughing or sputum production this morning. Denies CP, SOB, HA, abdominal pain, nopo intake.

## 2019-10-28 NOTE — Progress Notes (Signed)
Pharmacy Antibiotic Note  Brandon Miles is a 69 y.o. male admitted on 10/27/2019 with pneumonia.  Pharmacy has been consulted for Zosyn dosing.  Of note pt was treated in May/June with Augmentin for Fusobacterium nucleatum empyema.  Plan: Zosyn 3.375g IV q8h (4-hour infusion).  Height: 5\' 9"  (175.3 cm) Weight: 57.2 kg (126 lb) IBW/kg (Calculated) : 70.7  Temp (24hrs), Avg:97.3 F (36.3 C), Min:97 F (36.1 C), Max:97.6 F (36.4 C)  Recent Labs  Lab 10/27/19 1415 10/27/19 2000  WBC 10.1  --   CREATININE 0.55*  --   LATICACIDVEN 2.3* 0.9    Estimated Creatinine Clearance: 71.5 mL/min (A) (by C-G formula based on SCr of 0.55 mg/dL (L)).    Allergies  Allergen Reactions   Codeine     REACTION: vomitting     Thank you for allowing pharmacy to be a part of this patients care.  Wynona Neat, PharmD, BCPS  10/28/2019 1:36 AM

## 2019-10-28 NOTE — Discharge Summary (Signed)
Name: Brandon Miles MRN: [id number] DOB: [birth year] 69 y.o. PCP: Brandon Morale, MD  Date of Admission: 10/27/2019  1:48 PM Date of Discharge:  Attending Physician: Sid Falcon, MD  Discharge Diagnosis: 1. Community acquired Pneumonia   Discharge Medications: Allergies as of 10/28/2019      Reactions   Codeine    REACTION: vomitting      Medication List    TAKE these medications   acetaminophen-codeine 120-12 MG/5ML solution Place 10 mLs into feeding tube 3 (three) times daily as needed for moderate pain.   doxycycline 100 MG tablet Commonly known as: VIBRA-TABS Take 1 tablet (100 mg total) by mouth 2 (two) times daily for 4 days.   EQL MUCUS RELIEF CHILDRENS PO Place 10 mLs into feeding tube 2 (two) times daily as needed (for nasal congestion).   feeding supplement (OSMOLITE 1.5 CAL) Liqd Place 474 mLs into feeding tube 4 (four) times daily.   ferrous sulfate 300 (60 Fe) MG/5ML syrup Place 5 mLs (300 mg total) into feeding tube daily with breakfast.   Geritol Liqd Place 15 mLs into feeding tube daily.   Glucosamine-Chondroitin 1500-1200 MG/30ML Liqd Place 15 mLs into feeding tube daily.   ipratropium-albuterol 0.5-2.5 (3) MG/3ML Soln Commonly known as: DUONEB Take 3 mLs by nebulization daily as needed (for wheezing or shortness of breath).   magic mouthwash Soln Swish and spit 10 mLs 4 (four) times daily as needed (for thrush).   metFORMIN 500 MG tablet Commonly known as: GLUCOPHAGE Place 1 tablet (500 mg total) into feeding tube 2 (two) times daily with a meal.   NexIUM 20 MG packet Generic drug: esomeprazole Take 20 mg by mouth daily before breakfast. Per tube   ondansetron 4 MG tablet Commonly known as: ZOFRAN Take 1 tablet (4 mg total) by mouth every 6 (six) hours. What changed: how to take this   oxyCODONE 5 MG immediate release tablet Commonly known as: Oxy IR/ROXICODONE Place 1 tablet (5 mg total) into feeding tube every 4 (four)  hours as needed for moderate pain.   prochlorperazine 10 MG tablet Commonly known as: COMPAZINE Give 10 mg by tube every 6 (six) hours as needed for nausea or vomiting.   traMADol 50 MG tablet Commonly known as: ULTRAM Take 1 tablet (50 mg total) by mouth every 6 (six) hours as needed. What changed:   how to take this  reasons to take this       Disposition and follow-up:   Brandon Miles was discharged from [facility] in Stable condition.  At the hospital follow up visit please address:  1.  CAP - Patient is discharged with 4 days of Doxycycline - Follow up on urine Legionella and Strep pneumo antigen - Follow up on sputum culture if he was able to give sputum.   2.  Labs / imaging needed at time of follow-up: NO  3.  Pending labs/ test needing follow-up:  - Urine Legionella antigen - Urine Strep pneumo antigen - Sputum culture   Follow-up Appointments:   Hospital Course by problem list:  1. CAP  Patient with complaints of productive cough found to have radiologic evidence of multifocal pneumonia on CTA. He was recently admitted for Fusobacterium Nucleatum empyema s/p VATS and d/c with 2 weeks of Augmentin in May 2021. His COVID test is negative. PE was ruled out. Patient wanted to stay 1 night to receive IV antibiotics. He is clinically doing well. He has been afebrile, O2  sat well on RA, and without leukocytosis. Procal unremarkable. Other labs are also unremarkable. Patient is stable to be discharge today. Patient will start taking Doxycycline 100 mg Q12h for 4 days per G tube.  Discharge Vitals:   BP (!) 149/88 (BP Location: Right Arm)    Pulse 100    Temp 98 F (36.7 C) (Oral)    Resp 18    Ht 5\' 9"  (1.753 m)    Wt 57.2 kg    SpO2 97%    BMI 18.61 kg/m   Pertinent Labs, Studies, and Procedures:   CBC Latest Ref Rng & Units 10/28/2019 10/27/2019 09/08/2019  WBC 4.0 - 10.5 K/uL 4.1 10.1 5.9  Hemoglobin 13.0 - 17.0 g/dL 12.4(L) 13.8 10.4(L)   Hematocrit 39 - 52 % 38.8(L) 45.3 32.6(L)  Platelets 150 - 400 K/uL 371 395 386   BMP Latest Ref Rng & Units 10/27/2019 09/09/2019 09/08/2019  Glucose 70 - 99 mg/dL 129(H) 106(H) 142(H)  BUN 8 - 23 mg/dL 18 9 9   Creatinine 0.61 - 1.24 mg/dL 0.55(L) 0.42(L) 0.38(L)  Sodium 135 - 145 mmol/L 136 133(L) 133(L)  Potassium 3.5 - 5.1 mmol/L 4.6 4.3 4.4  Chloride 98 - 111 mmol/L 97(L) 94(L) 93(L)  CO2 22 - 32 mmol/L 29 33(H) 33(H)  Calcium 8.9 - 10.3 mg/dL 9.0 8.0(L) 7.9(L)   DG Chest 2 View  Result Date: 10/27/2019 CLINICAL DATA:  Suspected sepsis EXAM: CHEST - 2 VIEW COMPARISON:  10/27/2019 at 1222 hours FINDINGS: Stable heart size. Unchanged elevation of the right hemidiaphragm with blunting of the right costophrenic angle. Chronic coarsened interstitial markings. No new airspace consolidation. No pneumothorax. IMPRESSION: No acute findings. No significant interval change from previous study. Electronically Signed   By: Davina Poke D.O.   On: 10/27/2019 14:53   DG Chest 2 View  Result Date: 10/27/2019 CLINICAL DATA:  History of right-sided VATS 09/01/2019 for empyema. Dyspnea and congestion. EXAM: CHEST - 2 VIEW COMPARISON:  10/25/2019 chest radiograph. FINDINGS: Stable cardiomediastinal silhouette with normal heart size. No pneumothorax. Stable mild elevation of the right hemidiaphragm. Stable blunting of the right costophrenic angle. No left pleural effusion. No pulmonary edema or acute consolidative airspace disease. Chronic mild distortion of the interstitial markings. IMPRESSION: 1. Stable blunting of the right costophrenic angle, favor pleural-parenchymal scarring with residual small right pleural effusion not excluded. 2. No acute cardiopulmonary disease. Electronically Signed   By: Ilona Sorrel M.D.   On: 10/27/2019 12:26   CT Angio Chest PE W and/or Wo Contrast  Result Date: 10/27/2019 CLINICAL DATA:  Chest pain, dyspnea, pleurisy, cough EXAM: CT ANGIOGRAPHY CHEST WITH CONTRAST  TECHNIQUE: Multidetector CT imaging of the chest was performed using the standard protocol during bolus administration of intravenous contrast. Multiplanar CT image reconstructions and MIPs were obtained to evaluate the vascular anatomy. CONTRAST:  20mL OMNIPAQUE IOHEXOL 350 MG/ML SOLN COMPARISON:  09/01/2019 FINDINGS: Cardiovascular: There is excellent opacification of the pulmonary arterial tree. No intraluminal filling defect is identified to suggest acute pulmonary embolism. The central pulmonary arteries are of normal caliber. Cardiac size is within normal limits. Mild left ventricular hypertrophy is again noted. Extensive coronary artery calcification is seen within all 3 major coronary arteries. No pericardial effusion. Thoracic aorta is of normal caliber. Mediastinum/Nodes: No pathologic thoracic adenopathy. Thyroid unremarkable. Lungs/Pleura: Previously noted right basilar pigtail catheter has been removed. Much of the fluid component previously noted has been evacuated, though small amount of residual loculated gas appears within the pleural space, likely representing a  a pneumothorax ex vacuo. Marked adjacent pleural thickening is again identified. No significant residual fluid component. There is interval development of extensive multifocal pulmonary infiltrate with centrilobular and peribronchial nodularity identified most in keeping with atypical infection in the acute setting. There is associated mild bronchial wall thickening in keeping with airway inflammation. No pneumothorax. No central obstructing lesion. Upper Abdomen: Cholelithiasis noted. Musculoskeletal: No acute bone abnormality. Review of the MIP images confirms the above findings. IMPRESSION: No pulmonary embolism. Interval near complete evacuation of the fluid component of the right empyema with removal of the right chest tube. Small residual loculated pneumothorax ex vacuo. Interval development of multifocal pulmonary infiltrates most in  keeping with atypical infection. Associated airway inflammation. No central obstructing lesion. Extensive coronary artery calcification. Electronically Signed   By: Fidela Salisbury MD   On: 10/27/2019 18:35   Discharge Instructions: Discharge Instructions    Diet - low sodium heart healthy   Complete by: As directed    Discharge instructions   Complete by: As directed    Mr. Toner,   It was a pleasure taking care of you in the hospital. You were admitted for pneumonia and we were discharging you on antibiotics called Doxycycline which can be taken through your peg tube.  I have also sent the admission note to the ENT doctors at [facility].  Take Care!   Increase activity slowly   Complete by: As directed       Signed: Gaylan Gerold, DO 10/28/2019, 12:14 PM   Pager: [phone]

## 2019-10-31 ENCOUNTER — Encounter: Payer: Self-pay | Admitting: Adult Health Nurse Practitioner

## 2019-10-31 LAB — LEGIONELLA PNEUMOPHILA SEROGP 1 UR AG: L. pneumophila Serogp 1 Ur Ag: NEGATIVE

## 2019-11-01 LAB — CULTURE, BLOOD (ROUTINE X 2)
Culture: NO GROWTH
Culture: NO GROWTH

## 2019-12-07 DEATH — deceased

## 2020-08-28 IMAGING — CR DG CHEST 2V
2 series · 2 of 2 positions shown · non-contrast
Comparison: 10/25/2019 chest radiograph.

CLINICAL DATA: History of right-sided VATS 09/01/2019 for empyema.
Dyspnea and congestion.

EXAM:
CHEST - 2 VIEW

[w chest pa]
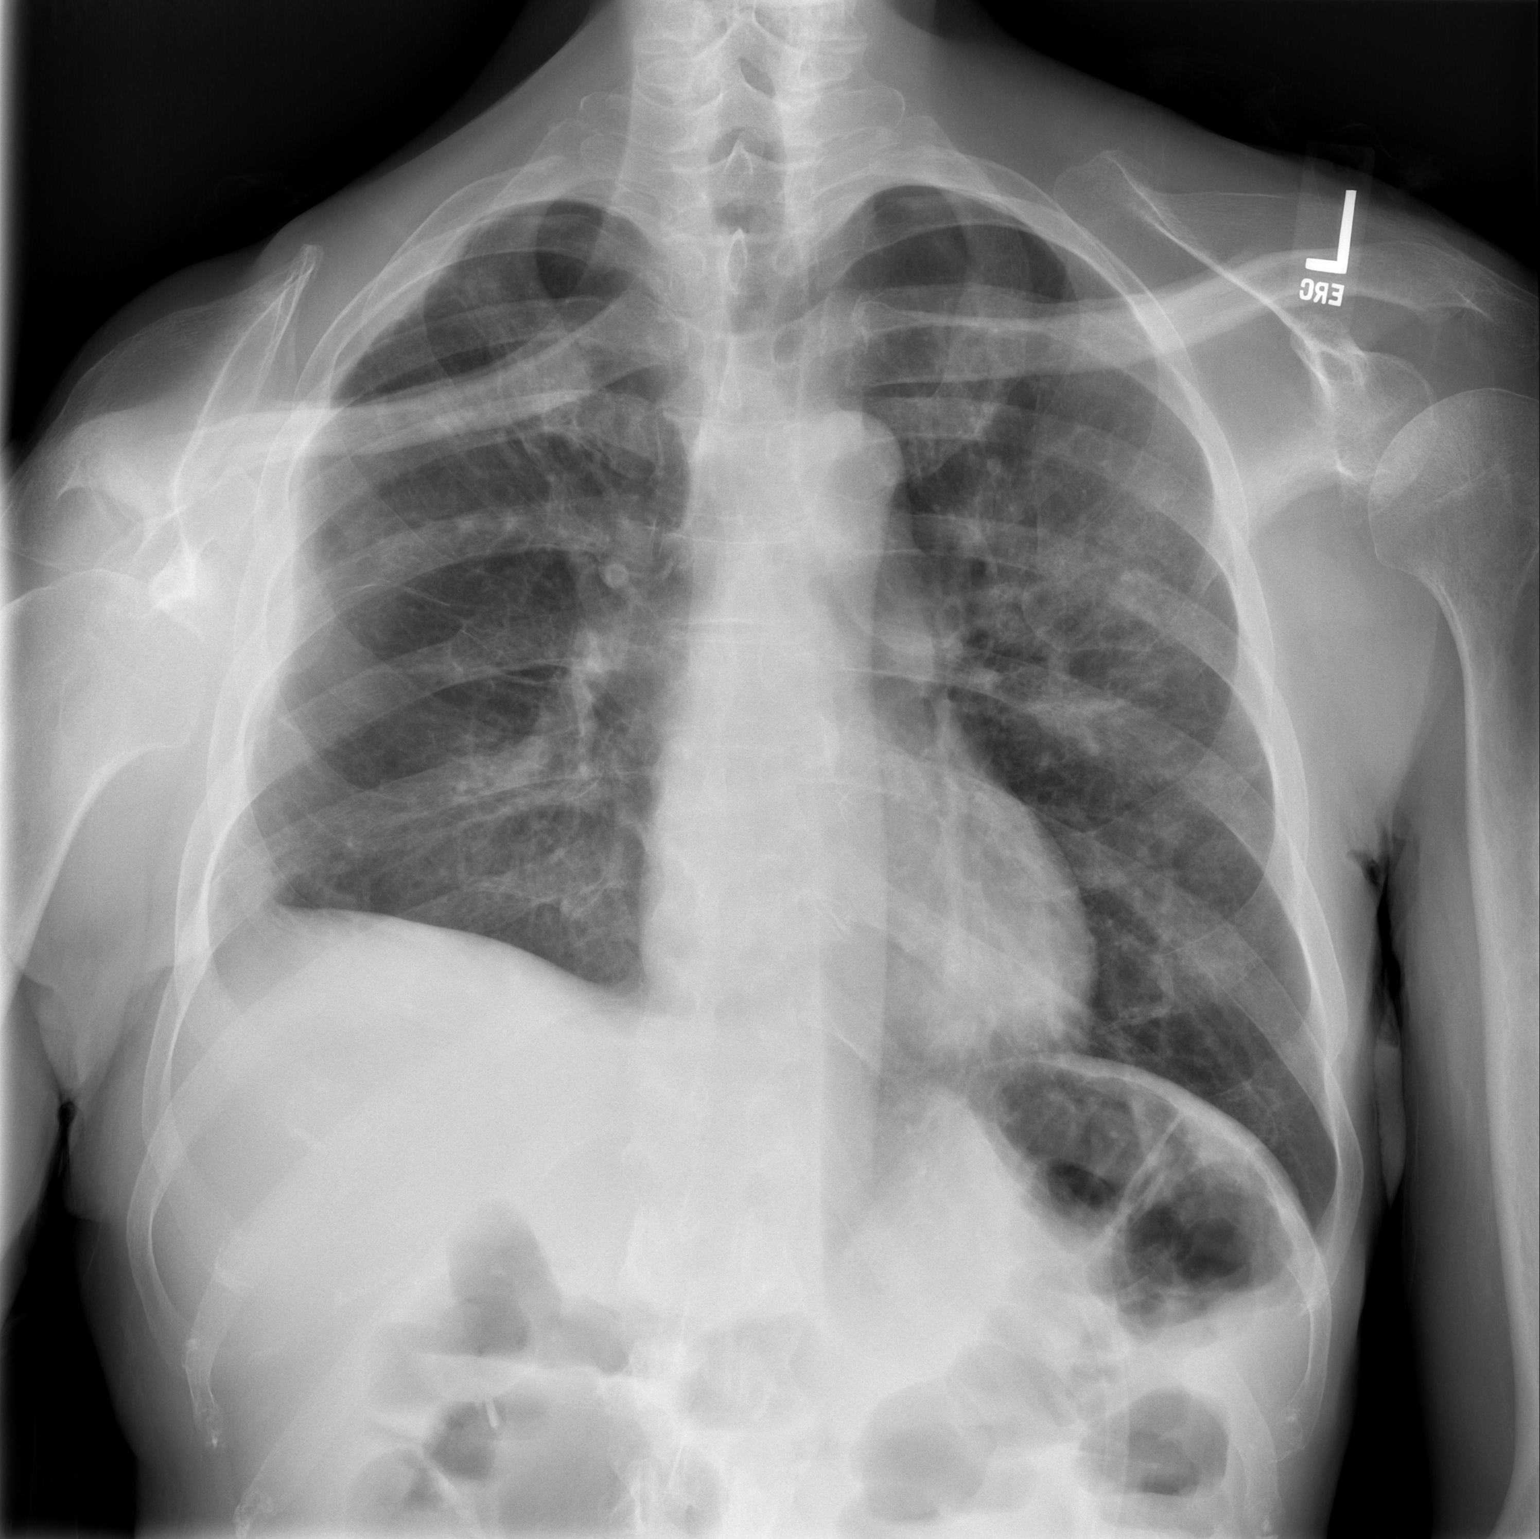

[w chest lat]
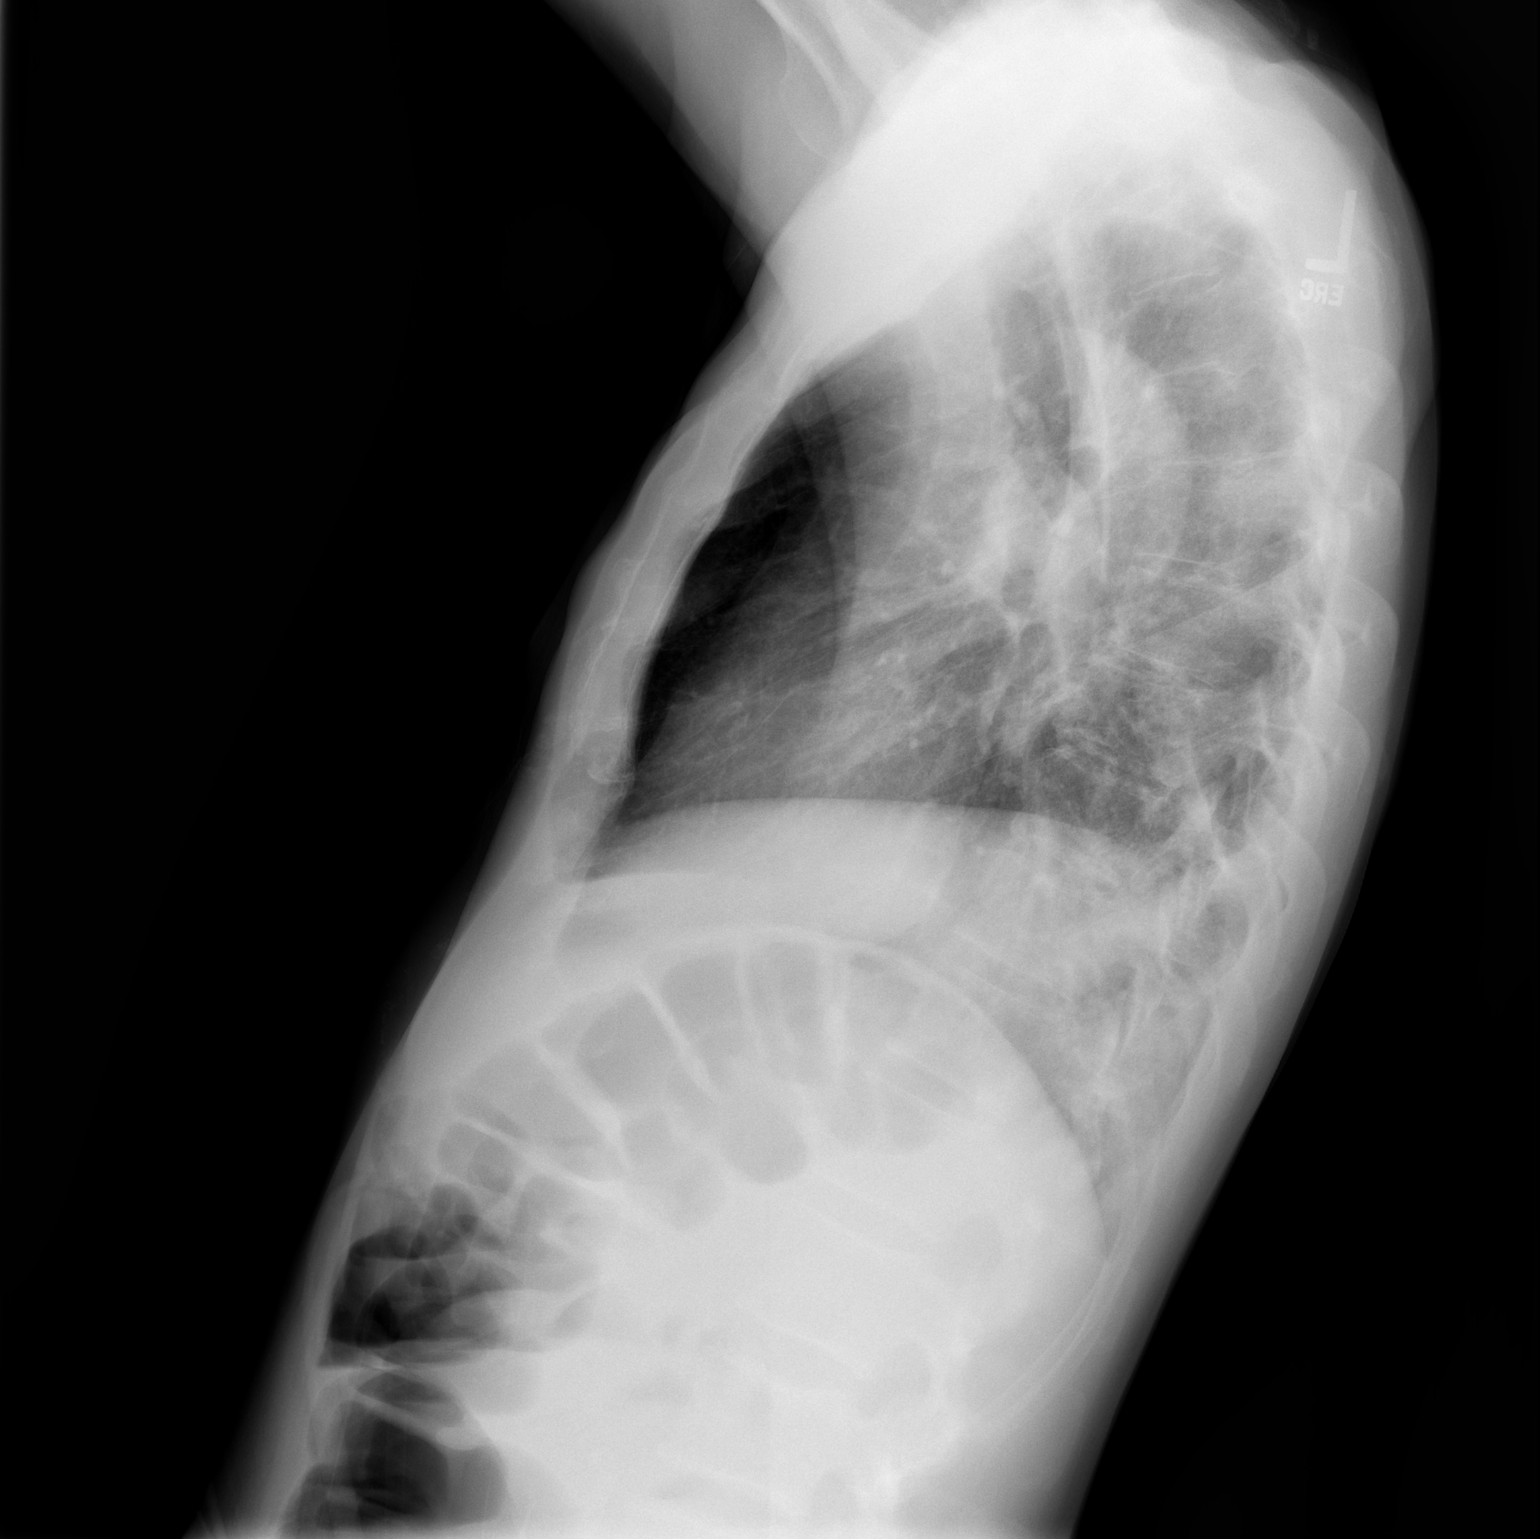

[2 of 2 positions shown; findings below may reference images not displayed]

FINDINGS: Stable cardiomediastinal silhouette with normal heart size. No
pneumothorax. Stable mild elevation of the right hemidiaphragm.
Stable blunting of the right costophrenic angle. No left pleural
effusion. No pulmonary edema or acute consolidative airspace
disease. Chronic mild distortion of the interstitial markings.
IMPRESSION: 1. Stable blunting of the right costophrenic angle, favor
pleural-parenchymal scarring with residual small right pleural
effusion not excluded.
2. No acute cardiopulmonary disease.
